# Patient Record
Sex: Female | Born: 1960
Health system: Western US, Academic
[De-identification: ages and names within clinical notes are randomized; demographics above are authoritative.]

---

## 2013-06-01 IMAGING — MG MAMMO DIAG BIL  W/CAD
4 series · 4 of 4 positions shown · non-contrast
Comparison: none

Images Obtained from [facility]
CLINICAL RA REF: Palpable lump left breast.   History of bilateral breast biopsies.
Comparison is made to exams dated:  02/23/2012 [facility] - [facility]OSPITAL], 01/14/2011, and 12/07/2008 [HOSPITAL] - [HOSPITAL].
The tissue of both breasts is heterogeneously dense. This may lower the sensitivity of mammography.
Current study was also evaluated with a Computer Aided Detection (CAD) system.
There is a 3.3 cm simple cyst in the left breast at 12 o'clock in the retroareolar region.  This correlates as palpated.
There also is a 3.5 cm x 1.8 cm x 2.2 cm fibroadenoma in the left breast at 3 o'clock middle depth.  This is increased in size and correlates with the biopsy.  There is a biopsy clip associated with
the fibroadenoma.
Additionally, there is a 2 cm simple cyst in the left breast at 2 o'clock middle depth 6 cm from the nipple.  This correlates as an incidental finding.
No other significant abnormalities are seen in either breast on the mammogram or left targeted ultrasound.
Your patient's mammogram demonstrates that she has dense breast tissue, which could hide small abnormalities.  In compliance with [place] 2522 the patient has been sent a letter which informs
her that she has dense breast tissue and might benefit from supplementary screening tests depending on her individual risk factors.  The patient may contact you if she has any questions or concerns.

[R CC]
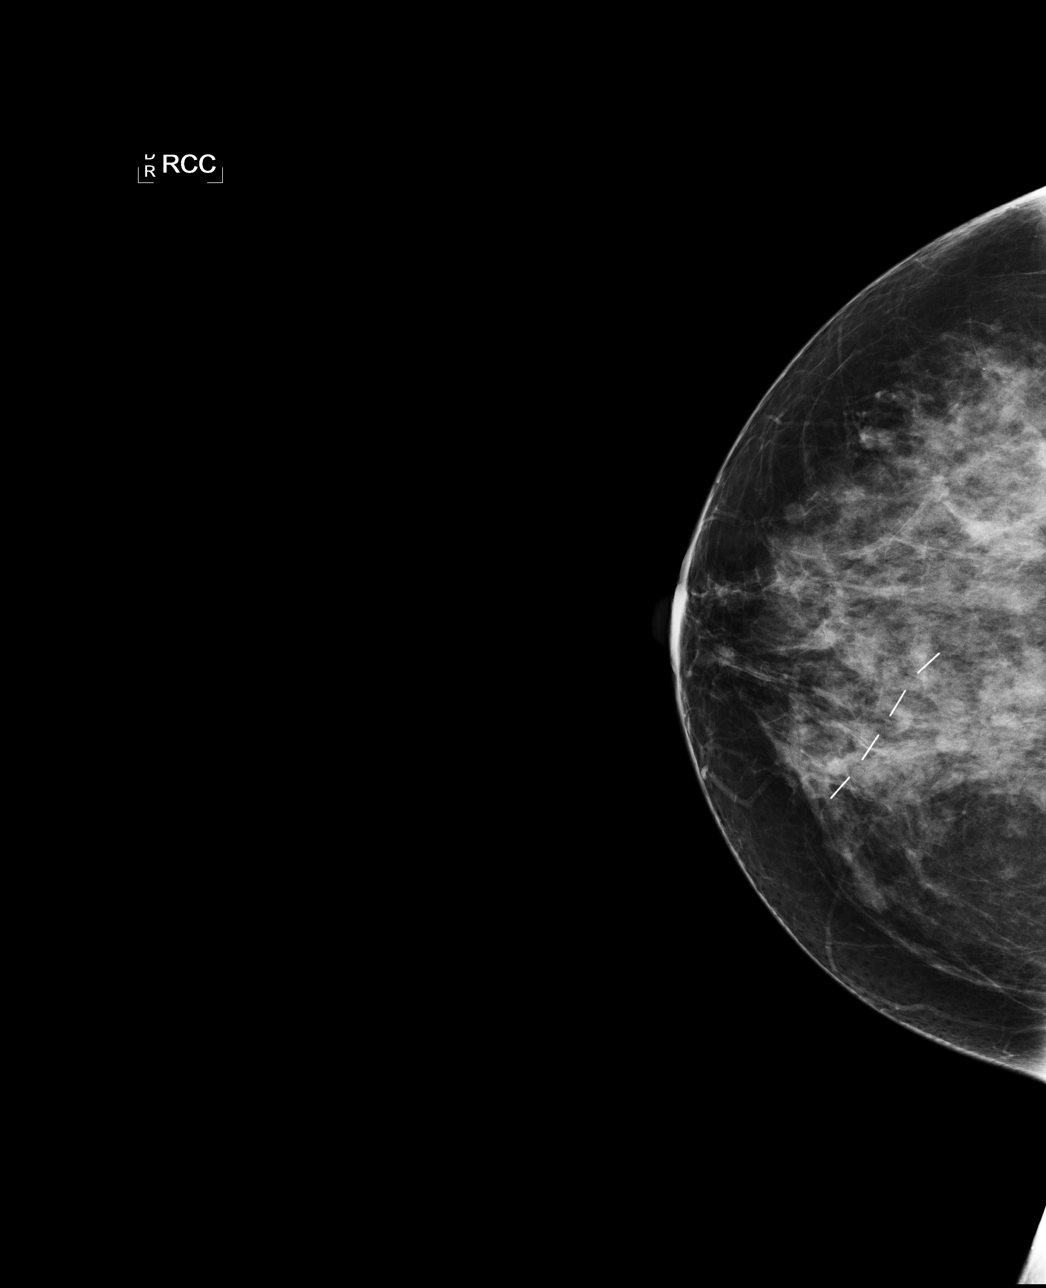

[L CC]
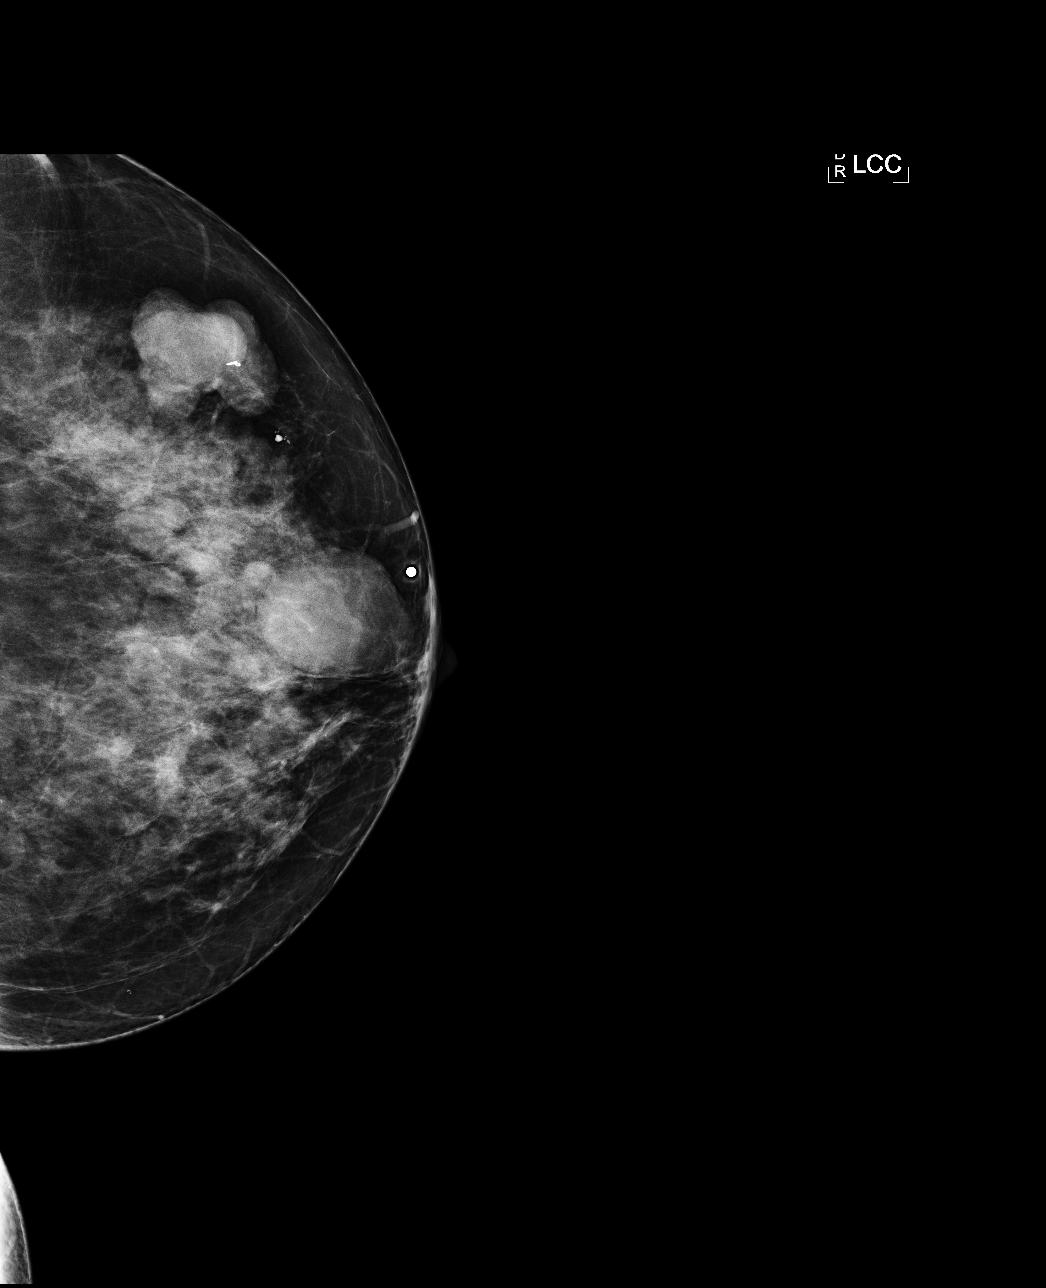

[L MLO]
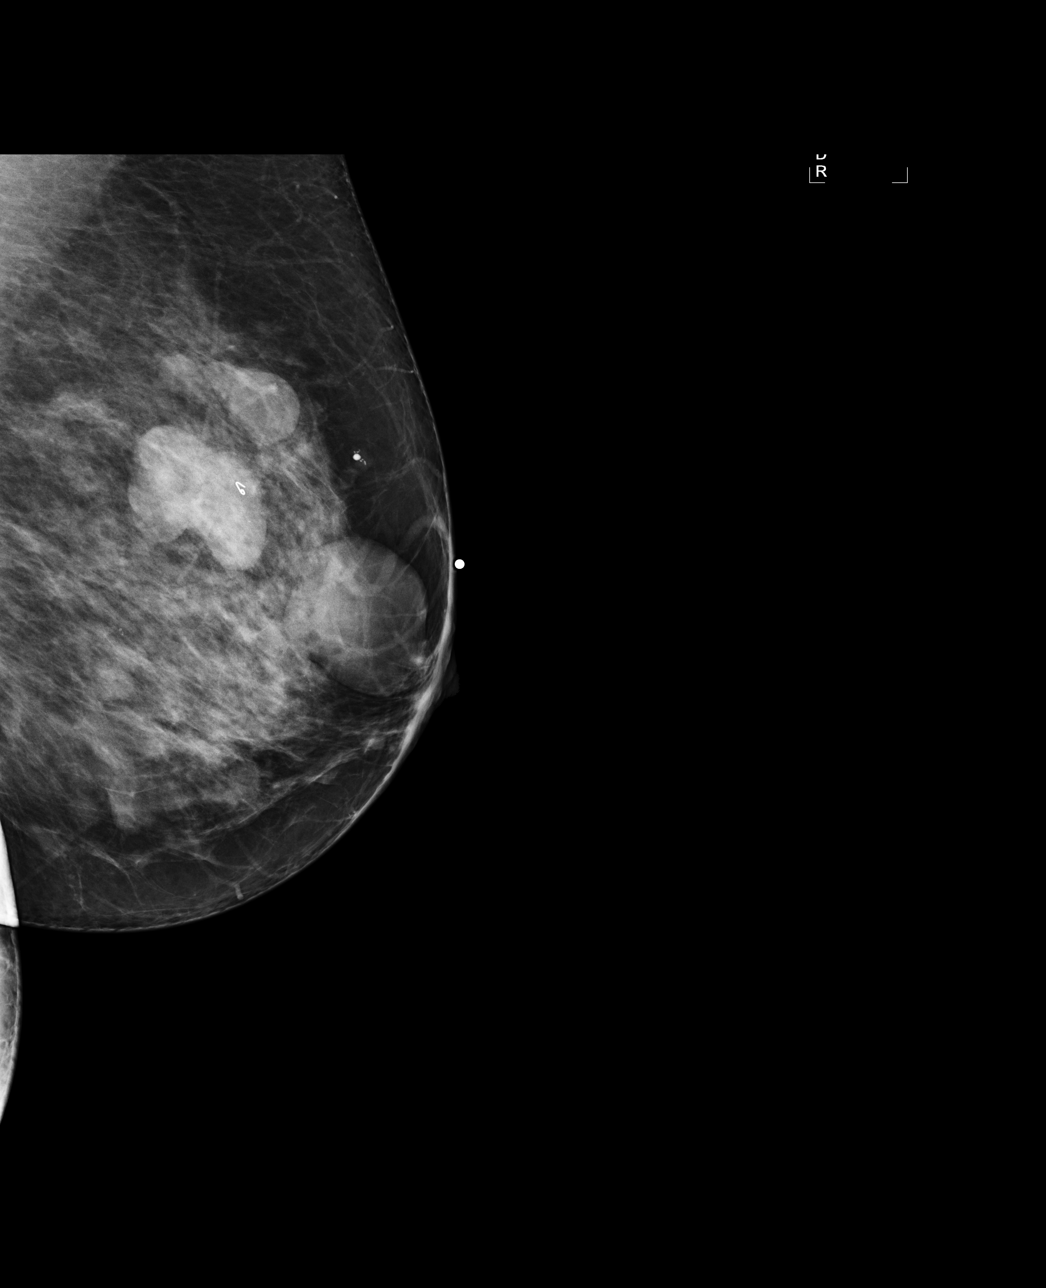

[R MLO]
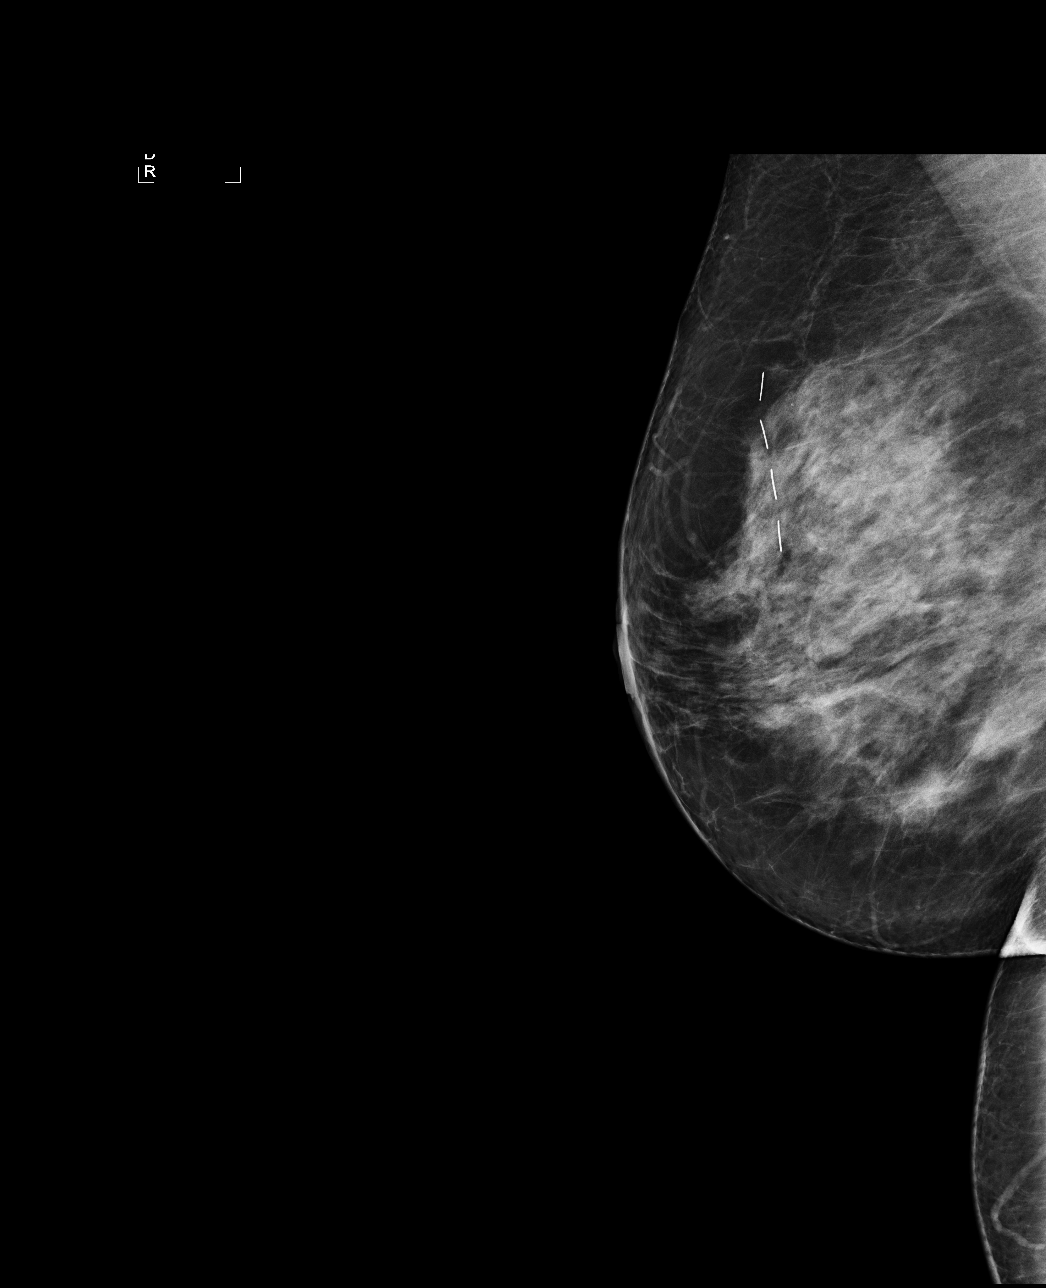

[4 of 4 positions shown; findings below may reference images not displayed]

IMPRESSION: , TARGETED ULTRASOUND IMPRESSION:
1. The 3.3 cm simple cyst in the left breast at 12 o'clock in the retroareolar region is benign.
2. The 3.5 cm x 1.8 cm x 2.2 cm fibroadenoma in the left breast at 3 o'clock middle depth has increased in size. Due to the increasing size, consider surgical consultation to discuss excision versus
ultrasound survelliance.
SUMMARY: Findings and recommendations were discussed with the patient.
mwm/:06/01/2013 [DATE]
letter sent: Normal
Mammogram BI-RADS: 2 Benign  Ultrasound BI-RADS: 2 Benign   [id number]

## 2013-06-01 IMAGING — US US BREAST LT
1 series · 13 of 16 positions shown · non-contrast
Comparison: none

Images Obtained from [facility]
CLINICAL RA REF: Palpable lump left breast.   History of bilateral breast biopsies.
Comparison is made to exams dated:  02/23/2012 [facility] - [[facility]OSPITAL], 01/14/2011, and 12/07/2008 [[facility]OS[facility]] - [HOSPITAL].
The tissue of both breasts is heterogeneously dense. This may lower the sensitivity of mammography.
Current study was also evaluated with a Computer Aided Detection (CAD) system.
There is a 3.3 cm simple cyst in the left breast at 12 o'clock in the retroareolar region.  This correlates as palpated.
There also is a 3.5 cm x 1.8 cm x 2.2 cm fibroadenoma in the left breast at 3 o'clock middle depth.  This is increased in size and correlates with the biopsy.  There is a biopsy clip associated with
the fibroadenoma.
Additionally, there is a 2 cm simple cyst in the left breast at 2 o'clock middle depth 6 cm from the nipple.  This correlates as an incidental finding.
No other significant abnormalities are seen in either breast on the mammogram or left targeted ultrasound.
Your patient's mammogram demonstrates that she has dense breast tissue, which could hide small abnormalities.  In compliance with [place] 2522 the patient has been sent a letter which informs
her that she has dense breast tissue and might benefit from supplementary screening tests depending on her individual risk factors.  The patient may contact you if she has any questions or concerns.

[Series 1: us breast left · 13 of 16 slices shown]
[im 1/16]
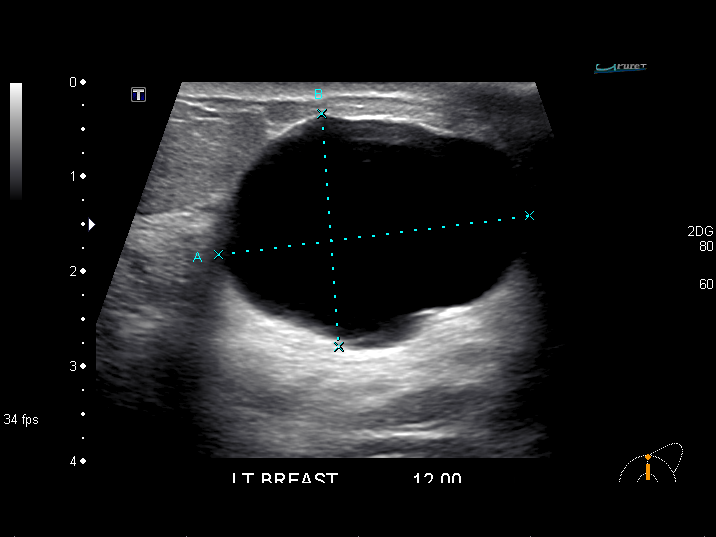
[im 2/16]
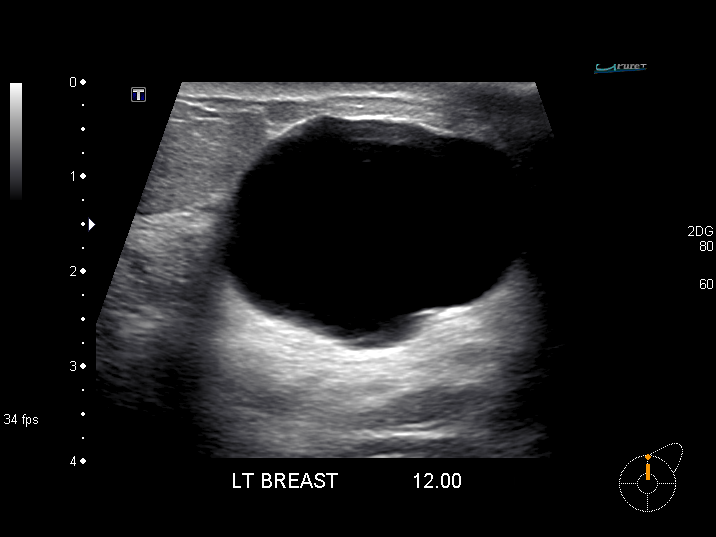
[im 4/16]
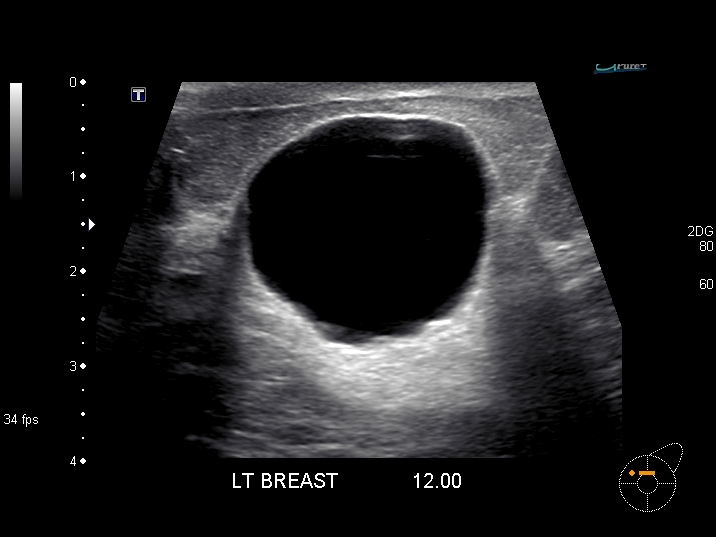
[im 5/16]
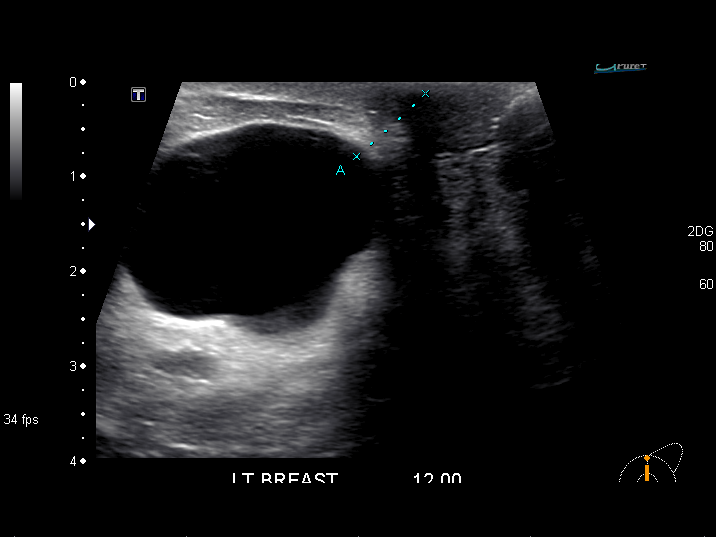
[im 6/16]
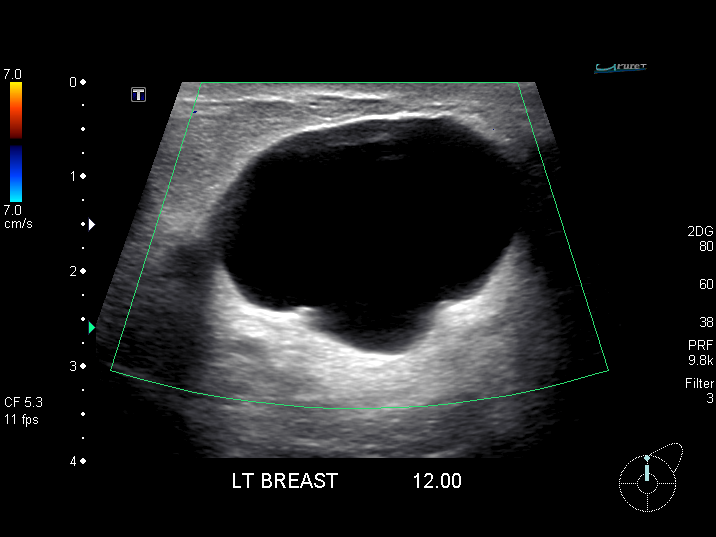
[im 7/16]
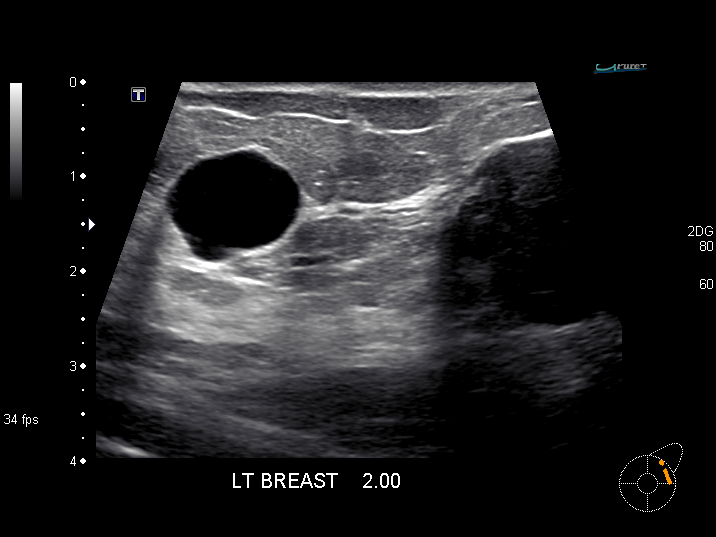
[im 9/16]
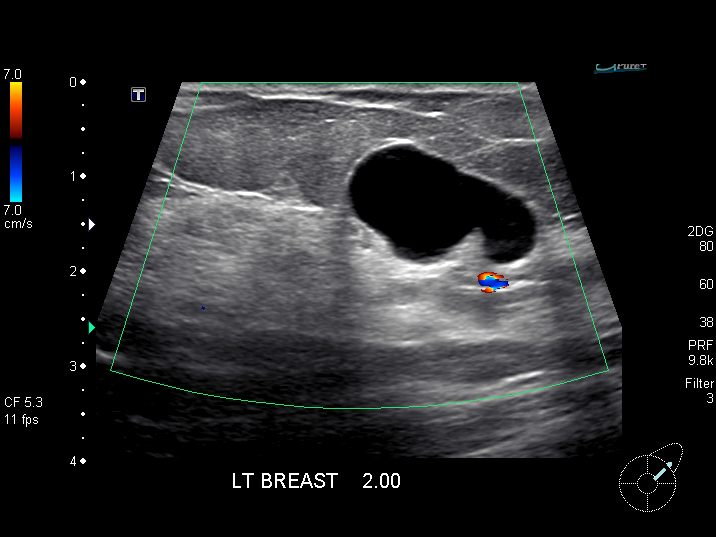
[im 10/16]
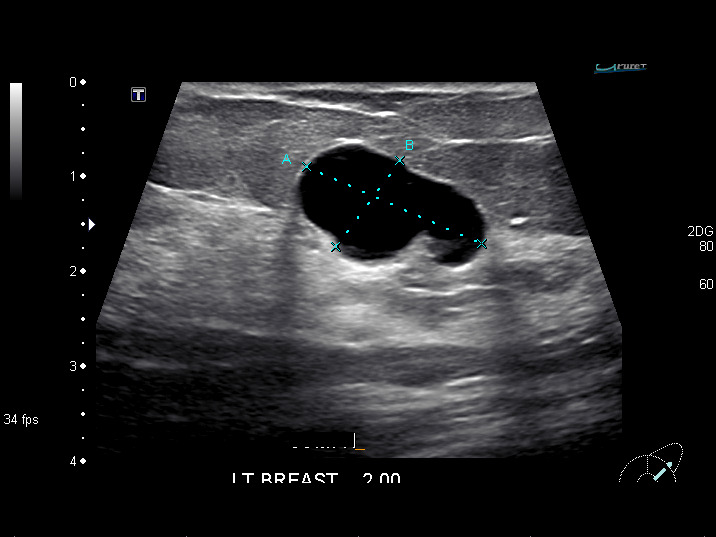
[im 11/16]
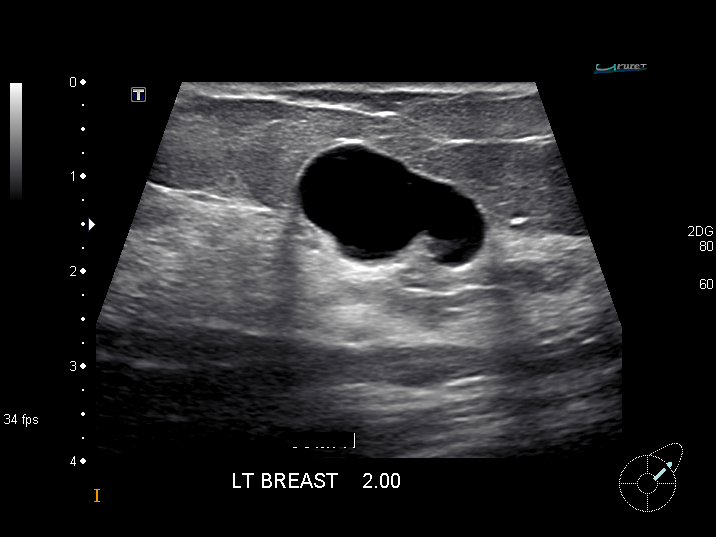
[im 12/16]
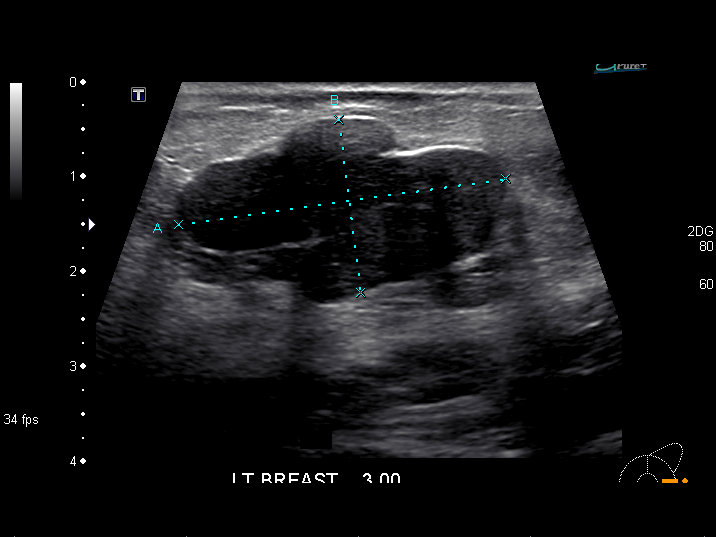
[im 13/16]
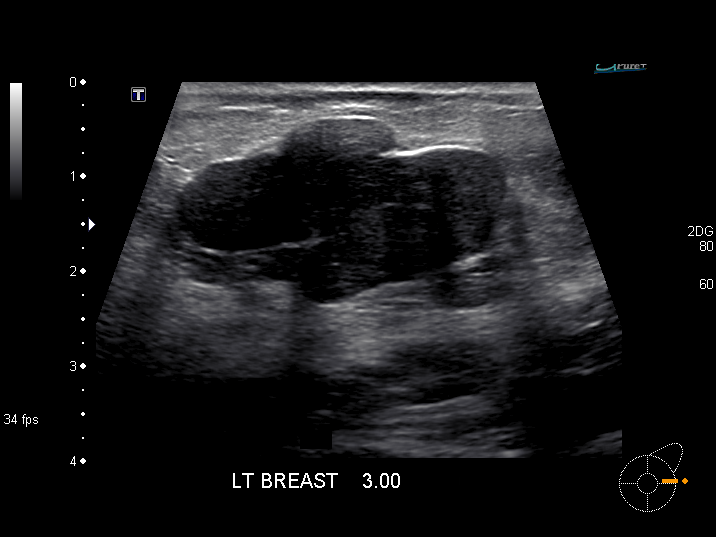
[im 15/16]
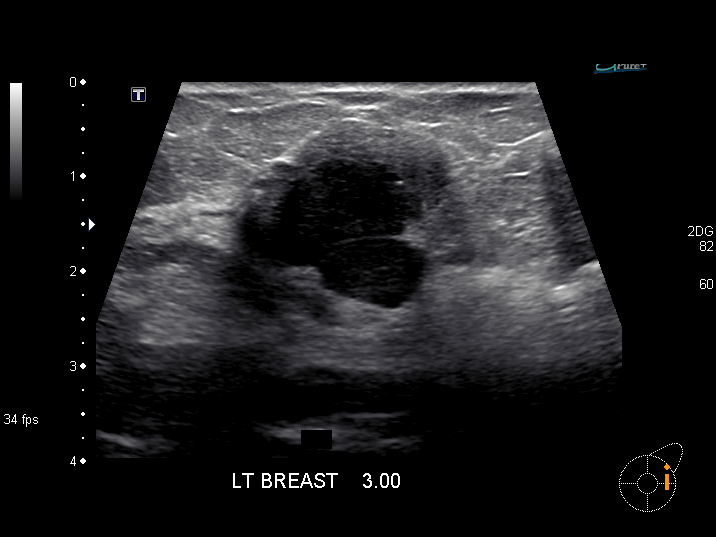
[im 16/16]
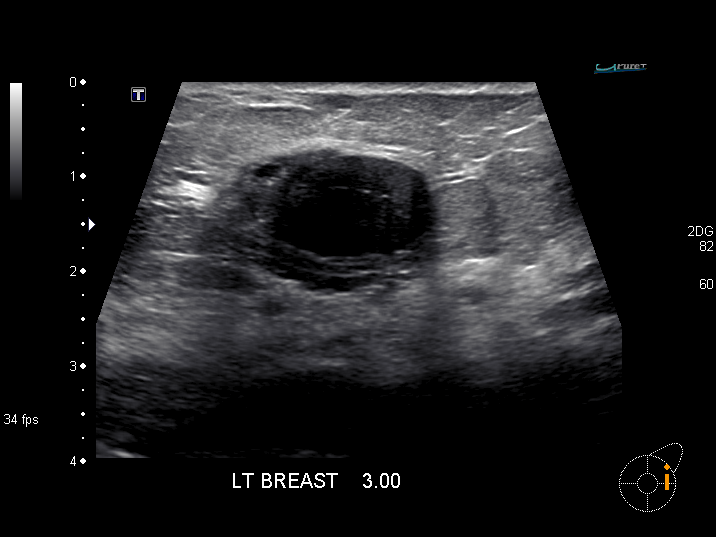

[13 of 16 positions shown; findings below may reference images not displayed]

IMPRESSION: , TARGETED ULTRASOUND IMPRESSION:
1. The 3.3 cm simple cyst in the left breast at 12 o'clock in the retroareolar region is benign.
2. The 3.5 cm x 1.8 cm x 2.2 cm fibroadenoma in the left breast at 3 o'clock middle depth has increased in size. Due to the increasing size, consider surgical consultation to discuss excision versus
ultrasound survelliance.
SUMMARY: Findings and recommendations were discussed with the patient.
mwm/:06/01/2013 [DATE]
letter sent: Normal
Mammogram BI-RADS: 2 Benign  Ultrasound BI-RADS: 2 Benign   [id number]

## 2016-02-19 IMAGING — US DOP CAROTID BILATERAL
1 series · 15 of 24 positions shown · non-contrast
Comparison: None

HISTORY: Systolic murmur
TECHNIQUE: Carotid Doppler ultrasound.

[Series 1: dop carotid bilateral · 15 of 41 slices shown]
[im 1/41]
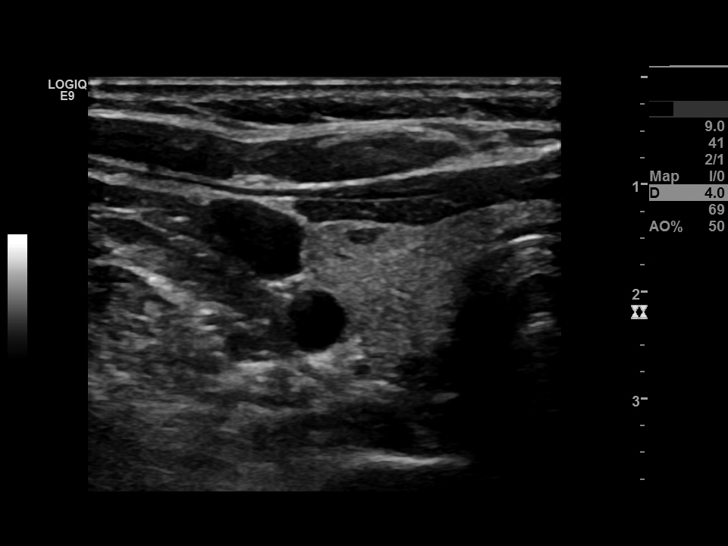
[im 4/41]
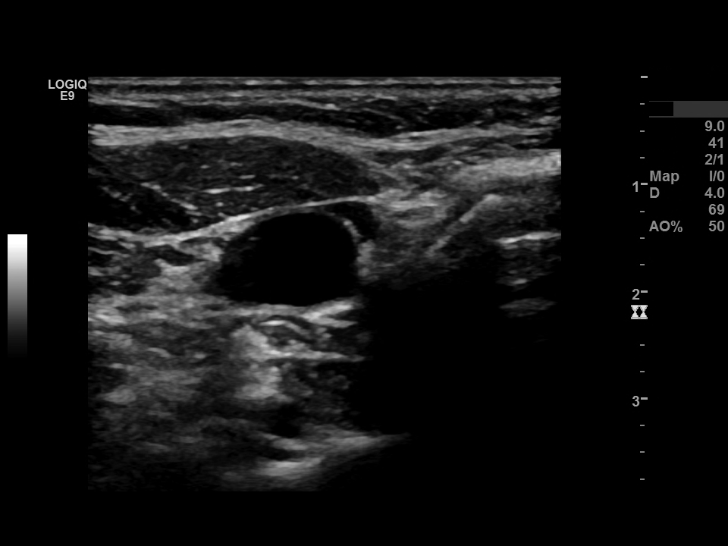
[im 7/41]
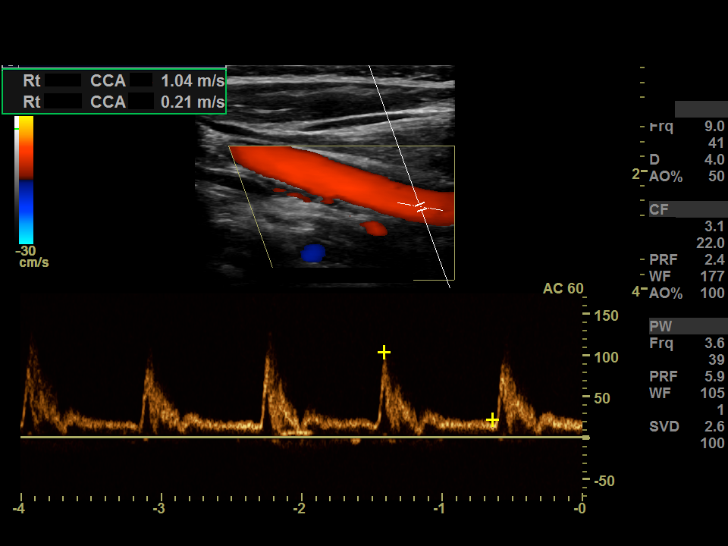
[im 9/41]
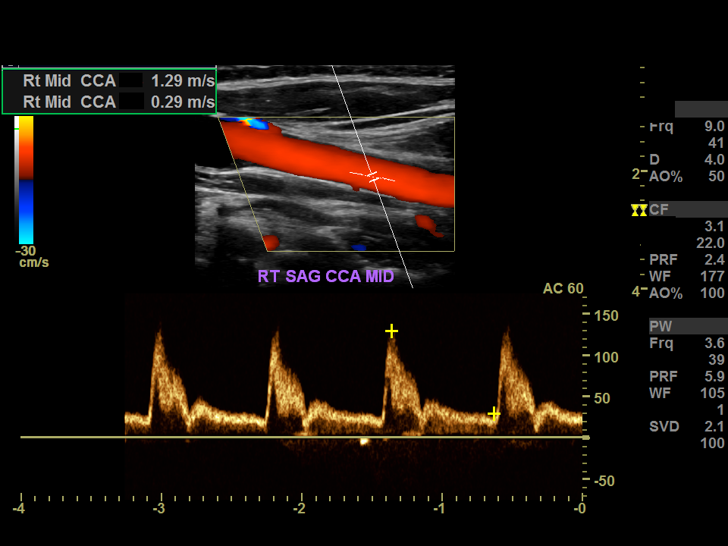
[im 13/41]
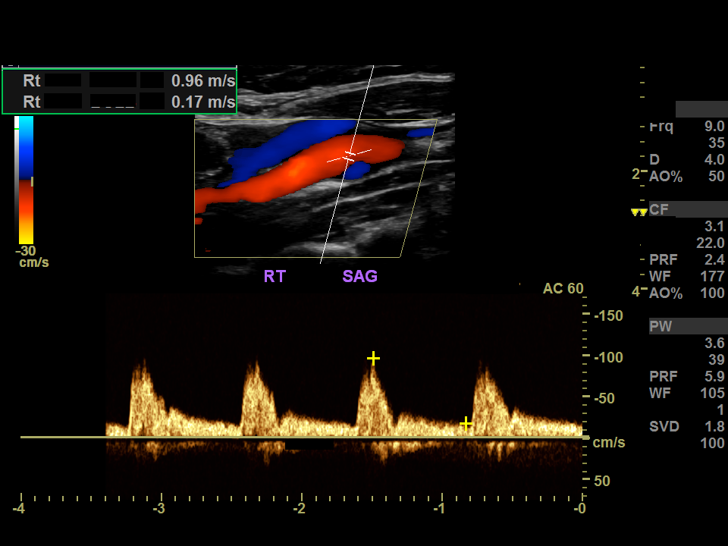
[im 14/41]
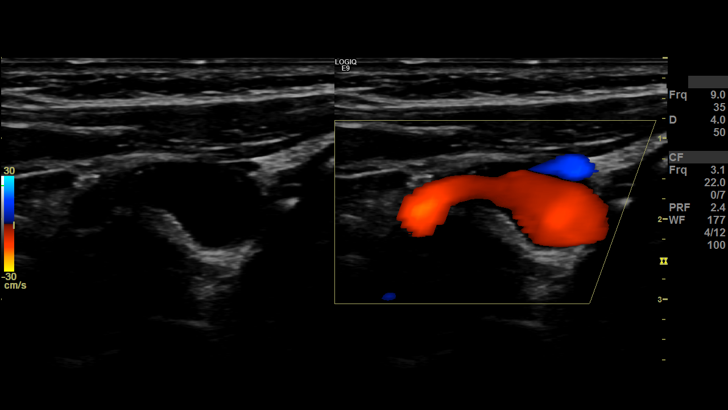
[im 18/41]
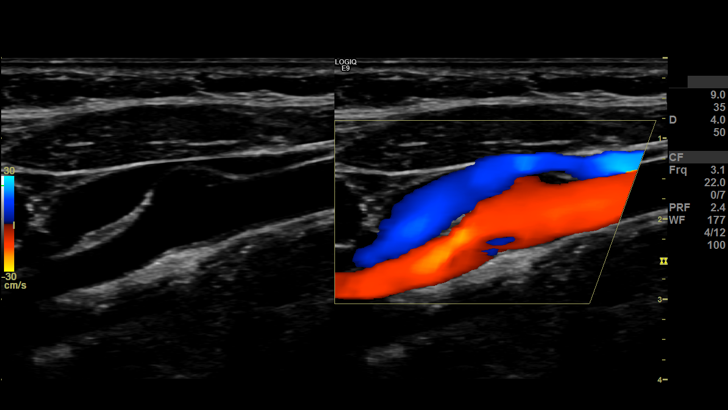
[im 21/41]
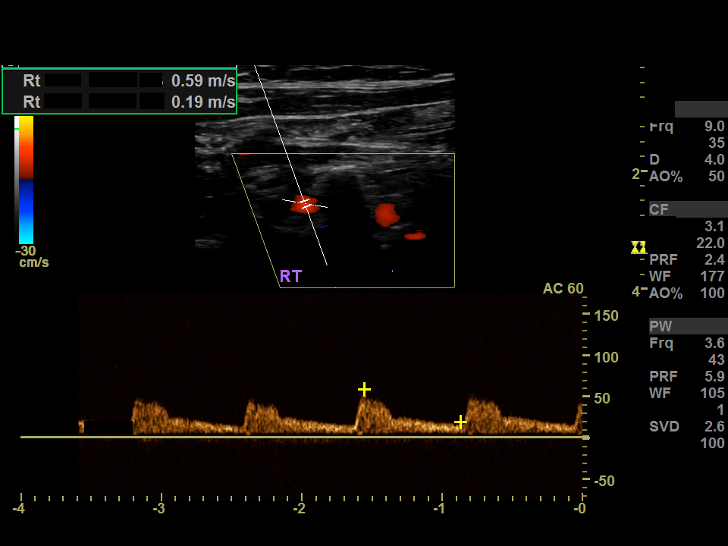
[im 23/41]
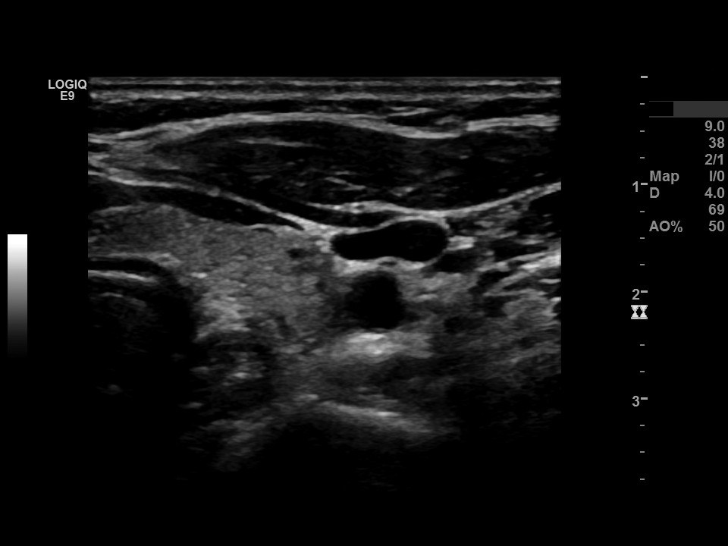
[im 27/41]
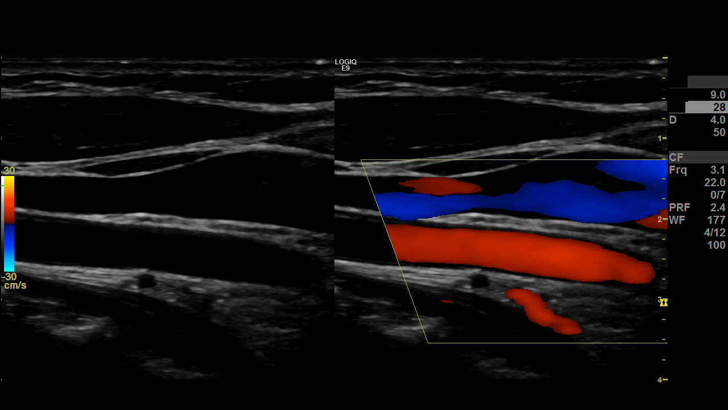
[im 28/41]
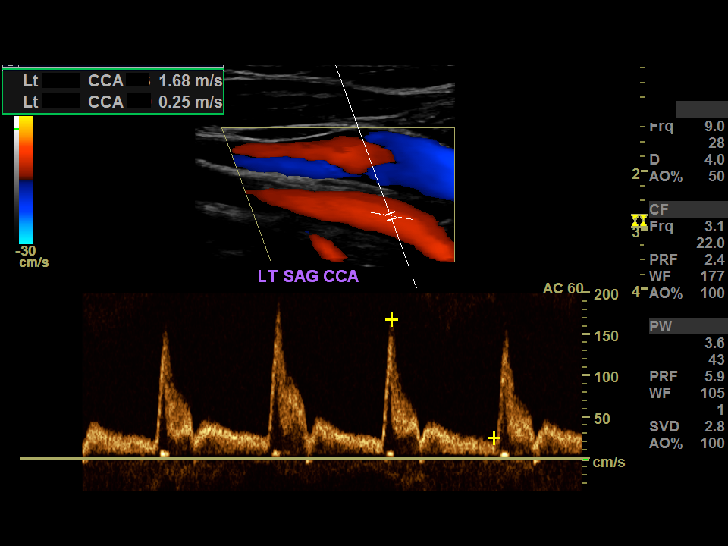
[im 32/41]
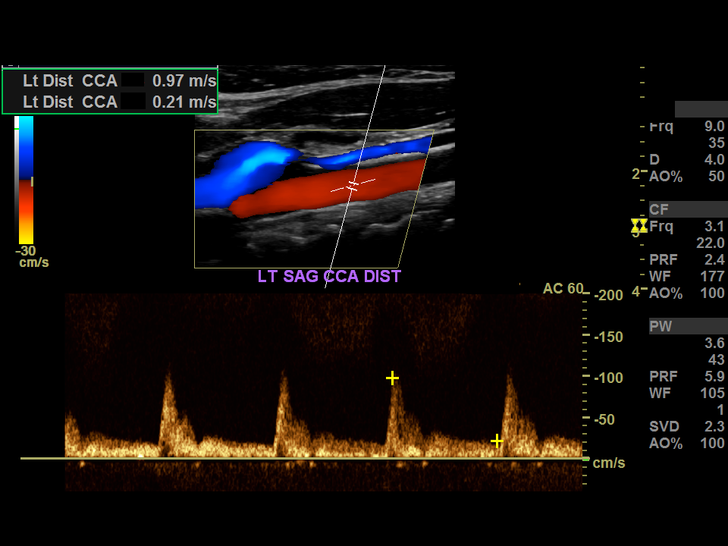
[im 35/41]
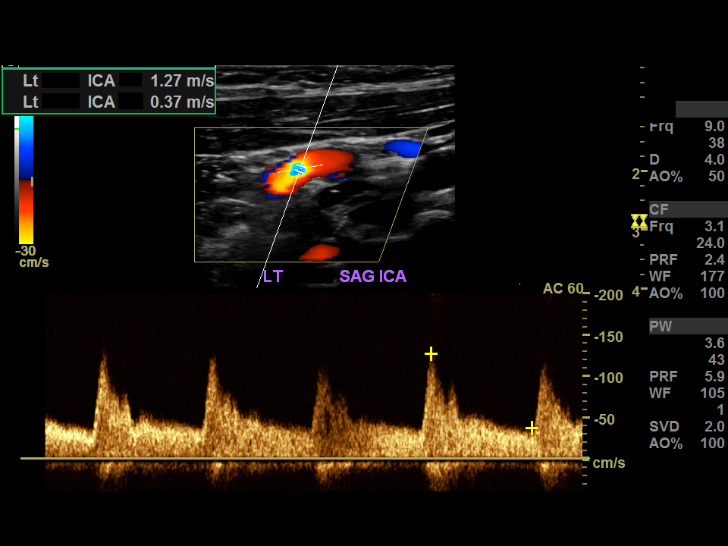
[im 37/41]
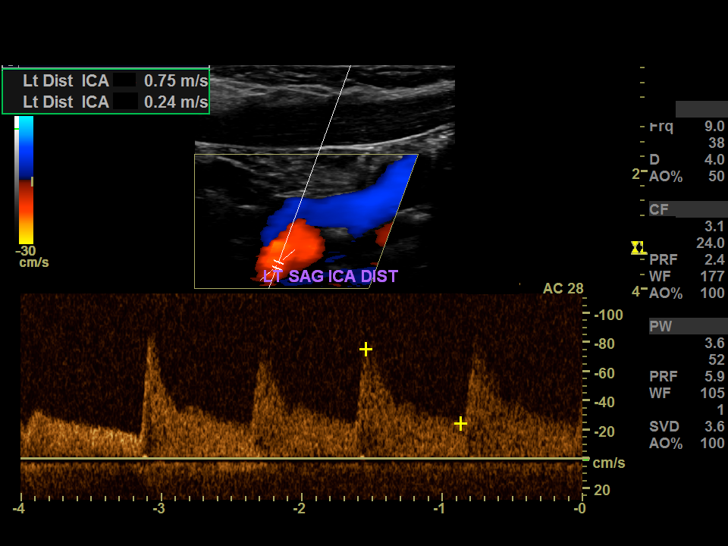
[im 41/41]
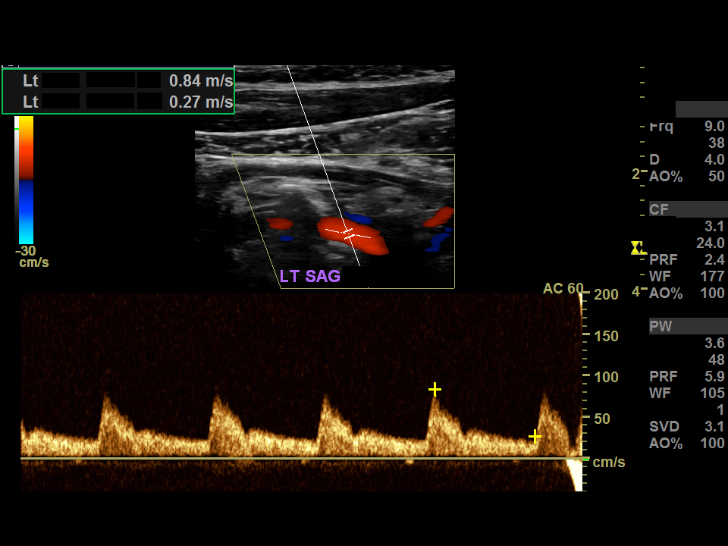

[15 of 24 positions shown; findings below may reference images not displayed]

FINDINGS: Duplex Doppler examination and color flow images were obtained of the carotid artery systems bilaterally.  The common carotid and internal carotid arteries are free of significant plaque.  Peak systolic velocities are normal, and there are no areas of flow disturbance or turbulence.  ICA/CCA velocity ratios are normal.  Both vertebral arteries demonstrate antegrade flow.
IMPRESSION: No evidence of hemodynamically significant stenosis.

## 2016-02-19 IMAGING — MG MAMMO SCRN BIL W/CAD COMBO
6 of 10 series · 6 of 26 positions shown · non-contrast
Comparison: none

Images Obtained from [facility]
CLINICAL RA REF: Mammo Scrn bilateral (Digital) W/Cad Routine with Tomosynthesis.
Digital images were generated from the 3D Tomosynthesis data acquired during the exam.
Comparison is made to exams dated:  06/01/2013 [facility] - [facility]], 02/23/2012 [HOSPITAL] - [HOSPITAL], and 01/14/2011 [facility]
The tissue of both breasts is heterogeneously dense. This may lower the sensitivity of mammography.
Current study was also evaluated with a Computer Aided Detection (CAD) system.
There are benign cysts in both breasts.  There also are post operative findings in both breasts.
No significant masses, calcifications, or other findings are seen in either breast.
There has been no significant interval change.
Your patient's mammogram demonstrates that she has dense breast tissue, which could hide small abnormalities.  In compliance with [place] Act [place]. 3883 the patient has been sent a letter which informs
her that she has dense breast tissue and might benefit from supplementary screening tests depending on her individual risk factors.  The patient may contact you if she has any questions or concerns.

[R MLO (1 of 2)]
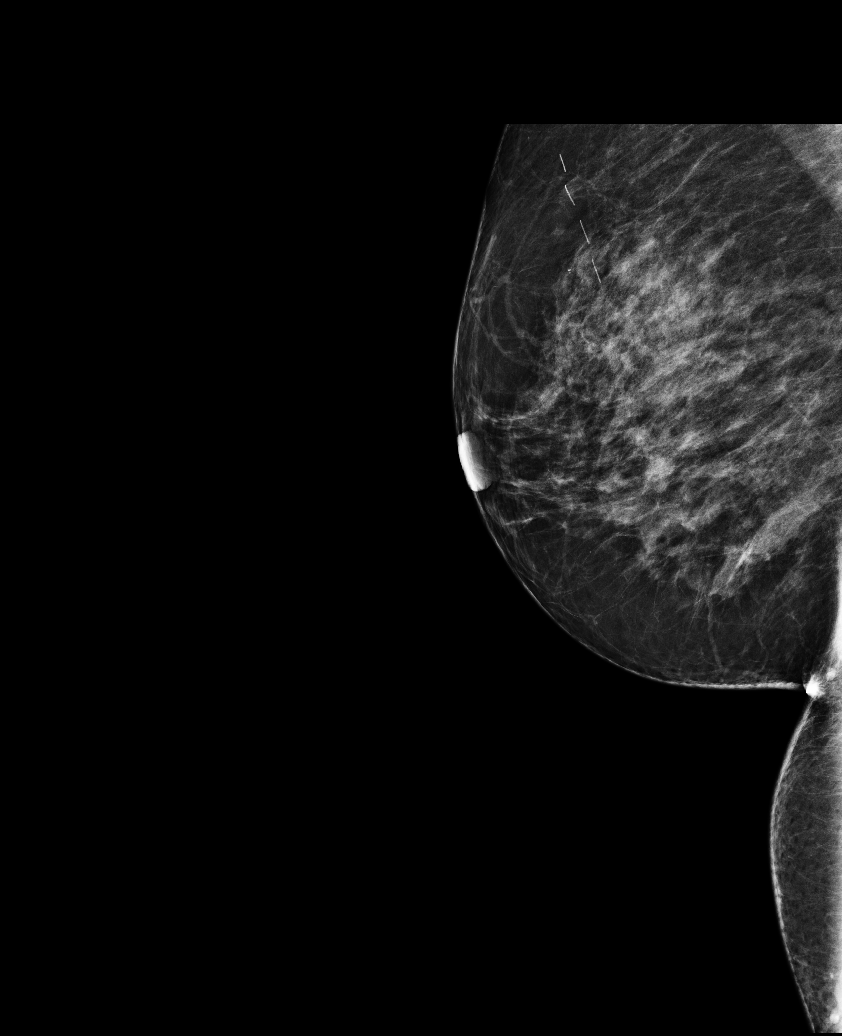

[L MLO (1 of 2)]
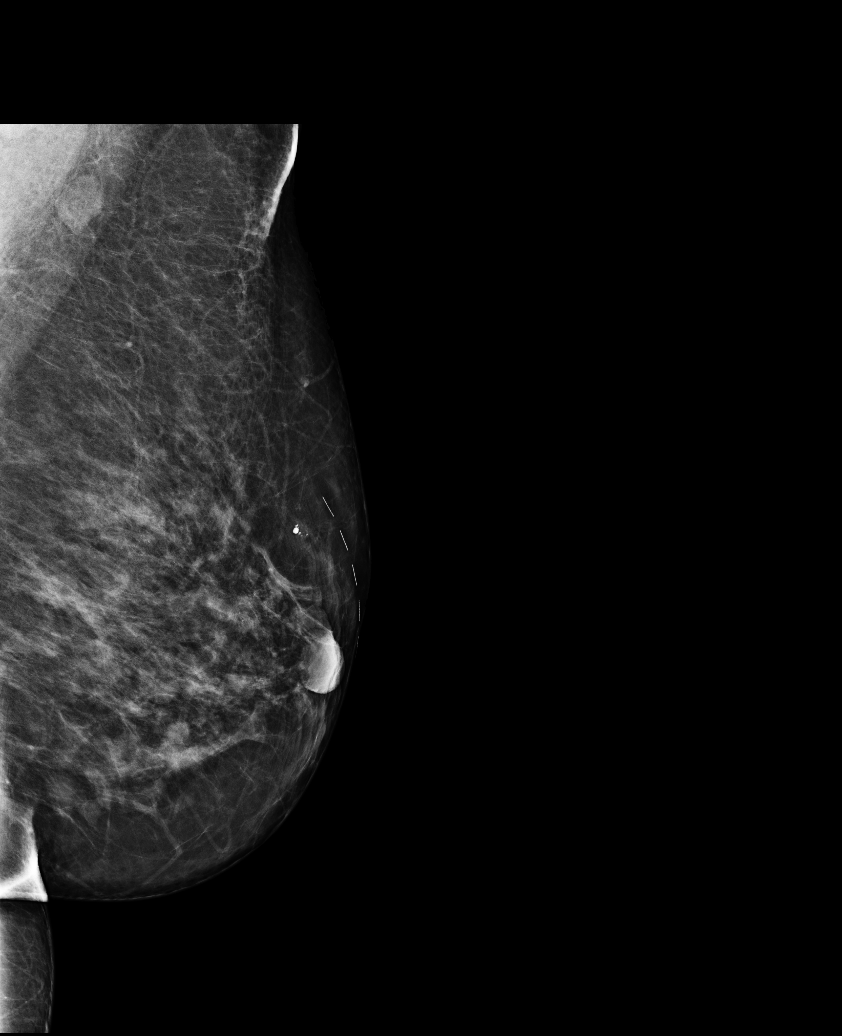

[R MLO (2 of 2)]
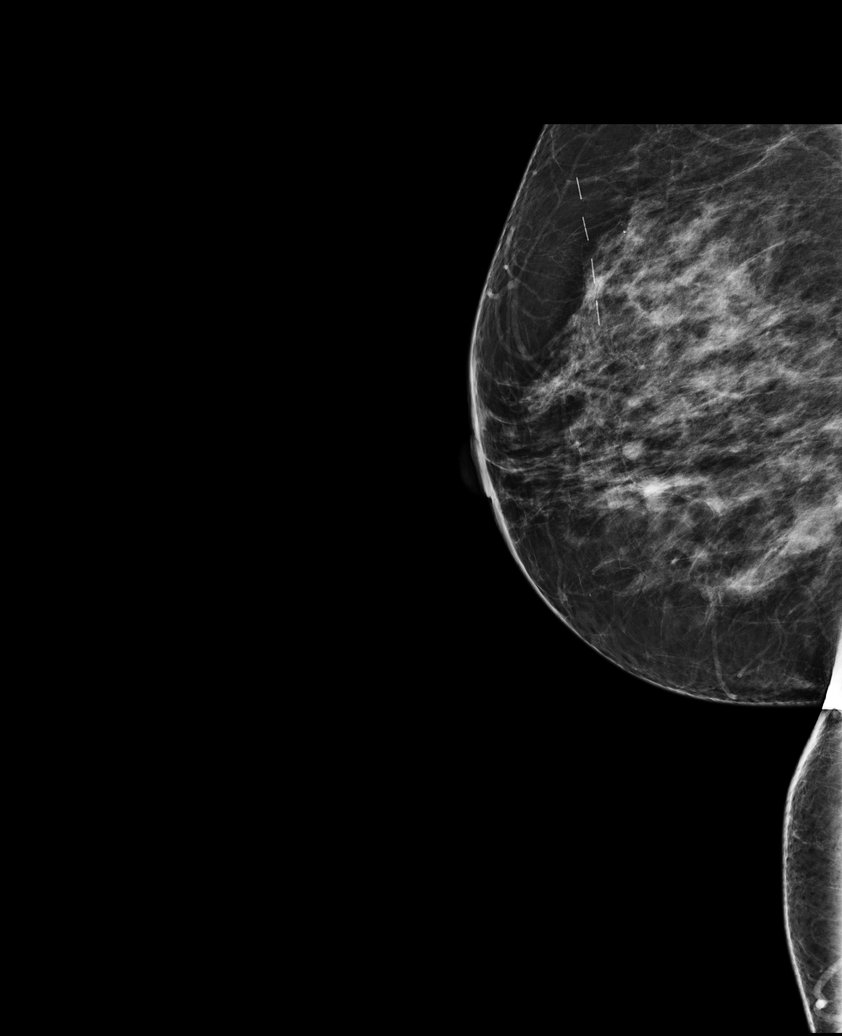

[L MLO (2 of 2)]
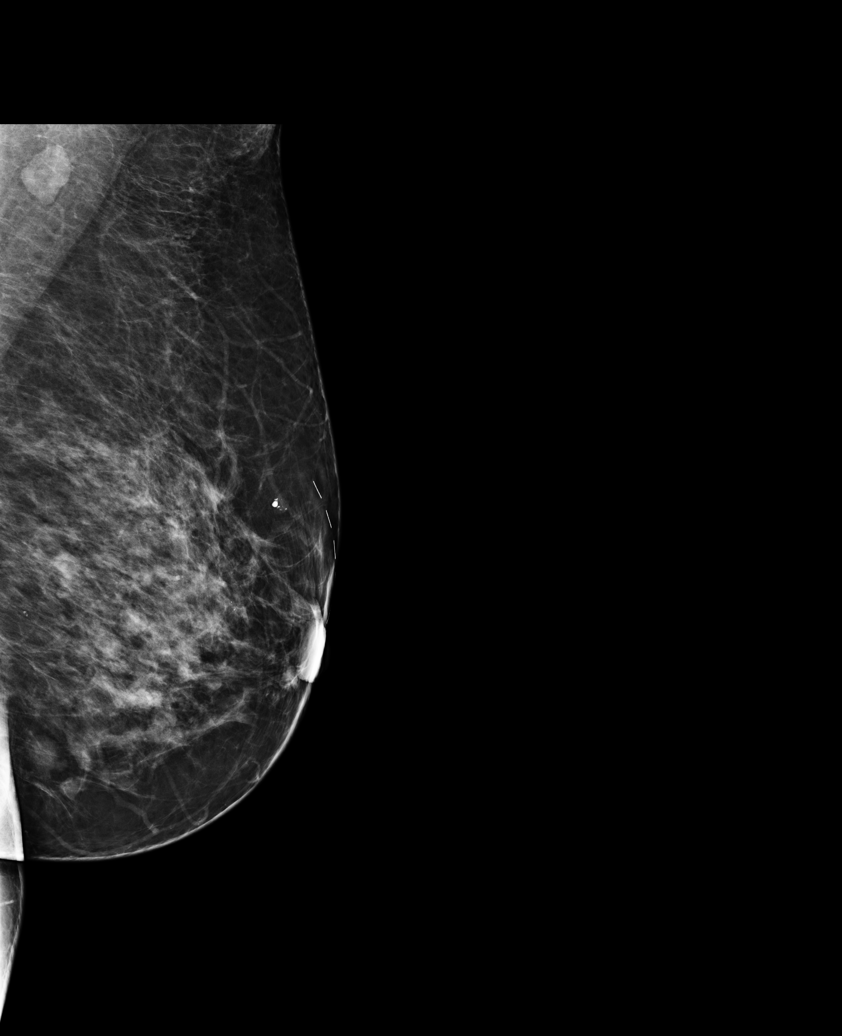

[R CC]
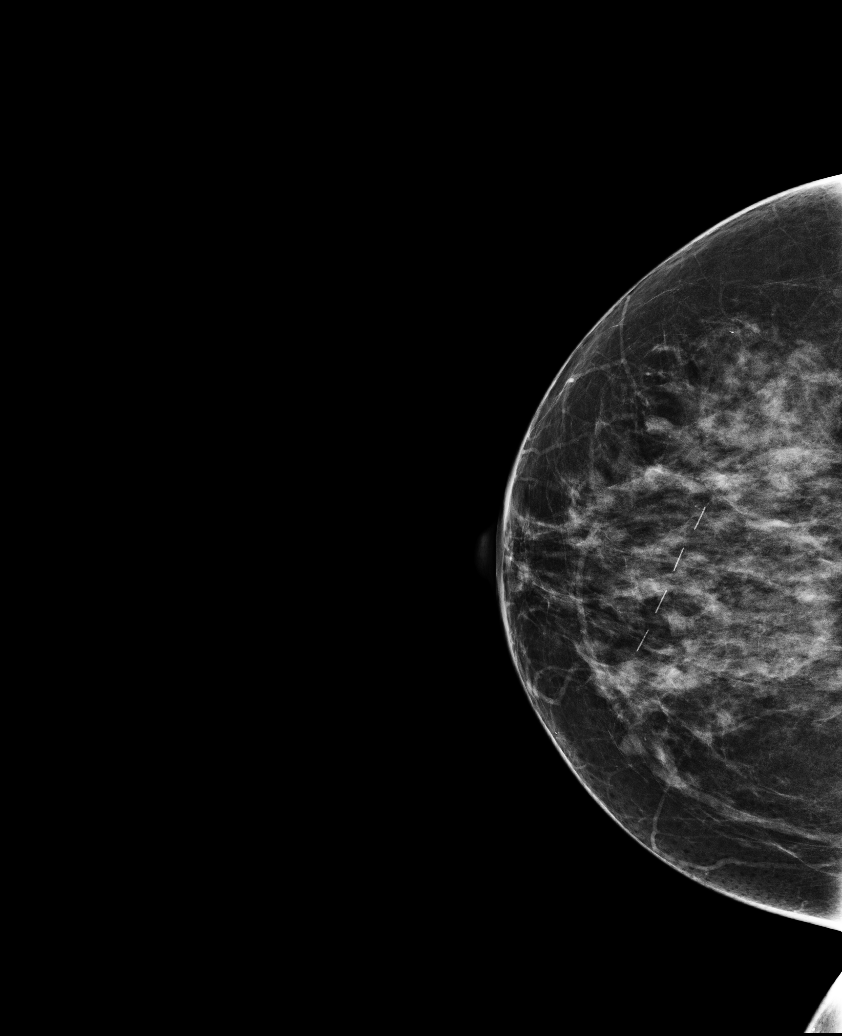

[L CC]
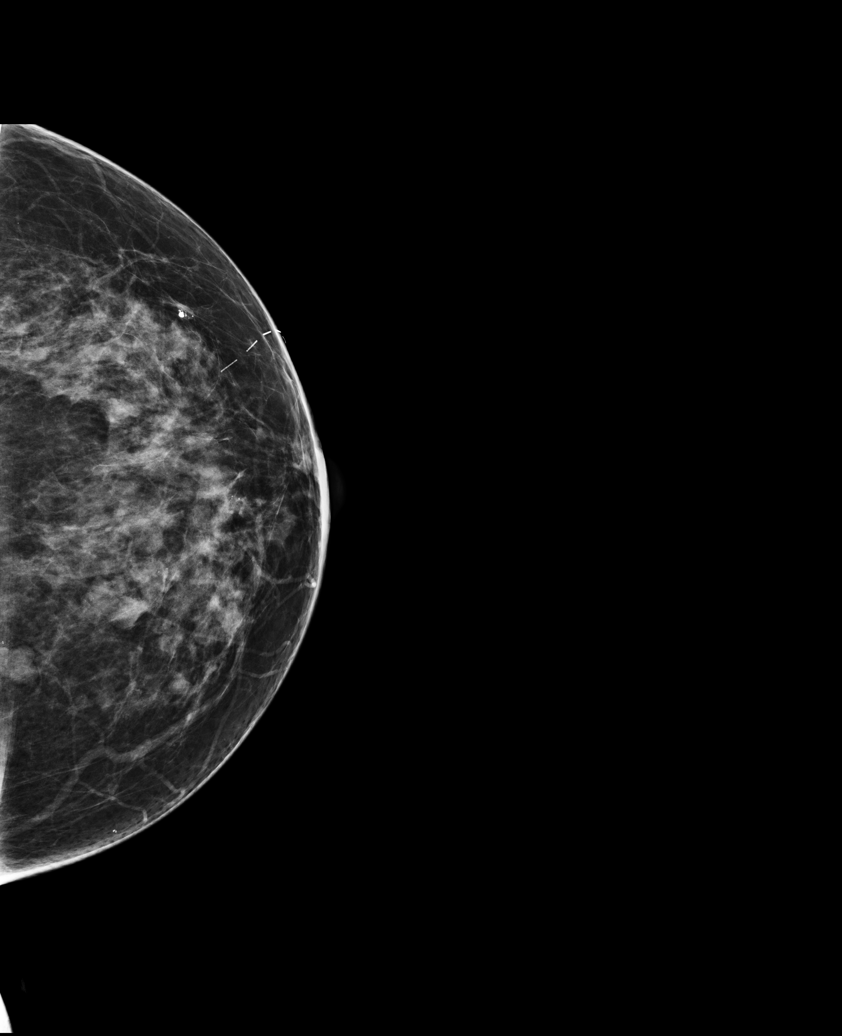

[6 of 26 positions shown; findings below may reference images not displayed]

IMPRESSION: There is no mammographic evidence of malignancy. A 1 year screening mammogram is recommended.
mc/penrad:02/22/2016 [DATE]
letter sent: Normal
Mammogram BI-RADS: 2 Benign

## 2018-09-28 ENCOUNTER — Institutional Professional Consult (permissible substitution): Payer: BLUE CROSS/BLUE SHIELD

## 2018-09-28 ENCOUNTER — Inpatient Hospital Stay: Payer: BLUE CROSS/BLUE SHIELD

## 2018-09-28 DIAGNOSIS — M48061 Spinal stenosis, lumbar region without neurogenic claudication: Secondary | ICD-10-CM

## 2018-09-28 DIAGNOSIS — Z01818 Encounter for other preprocedural examination: Secondary | ICD-10-CM

## 2018-09-28 NOTE — Progress Notes
PATIENT: Valerie Duncan   MRN: [id number]   DOB: 1961/05/03   DATE OF SERVICE:  09/28/2018   CARE TEAM: Patient Care Team:  Clydene Pugh, DO as PCP - General (Family Medicine)    Subjective:   REFERRING PHYSICIAN/SURGEON: Dr. Nigel Bridgeman  PLANNED SURGERY:  ALIF  PLANNED SURGERY DATE: 10/06/2018  Diagnosis: Stenosis    Review of Systems:  CONSTITUTIONAL:  No night sweats.  No fatigue, malaise, lethargy.  No fever or chills.  EYES:  No visual changes.  No eye pain.  No eye discharge.    ENT:  No runny nose.  No epistaxis.  No sinus pain.  No sore throat.  No odynophagia.  No ear pain.  No congestion.  BREASTS:  No breast pain, soreness, lumps, or discharge.  RESPIRATORY:  No cough.  No wheeze.  No hemoptysis.  No shortness of breath.  CARDIOVASCULAR:  No chest pains.  No palpitations. No syncopal episodes  GASTROINTESTINAL:  No abdominal pain.  No nausea or vomiting.  No diarrhea or constipation.  No hematemesis.  No hematochezia.  No melena.  GENITOURINARY:  No urgency.  No frequency.  No dysuria.  No hematuria.  No obstructive symptoms.  No discharge.  No pain.  No significant abnormal bleeding.  MUSCULOSKELETAL:  No musculoskeletal pain.  No joint swelling.  No arthritis.  NEUROLOGICAL:   No headache or neck pain.  No syncope or seizure. No motor , sensory or balance changes  PSYCHIATRIC:  No cognitive, memory or mood changes. No substance abuse.  SKIN:  No rashes.  No lesions.  No wounds.  ENDOCRINE:  No unexplained weight loss.  No polydipsia.  No polyuria.  No polyphagia.  HEMATOLOGIC:  No anemia.  No purpura.  No petechiae.  No prolonged or excessive bleeding.     ALLERGIC AND IMMUNOLOGIC:  No pruritus.  No swelling.     History of Left knee arthroscopy, spinal surgery x2      Family history of heart disease    No outpatient medications prior to visit.     No facility-administered medications prior to visit.        No Known Allergies    Social History     Tobacco Use   ??? Smoking status: Never Smoker ??? Smokeless tobacco: Never Used   Substance Use Topics   ??? Alcohol use: Yes     Comment: socially   ??? Drug use: Never         There is no immunization history on file for this patient.    Prior general anaesthetic - yes  Prior anaesthetic complications - no  HX of cardiac disease - no  Hx of pulmonary disease - no  HX of OSA - no  Smoker - no  ETOH abuse - no  Recreational drugs - no  IVDA -no      Objective:   BP 113/72  ~ Pulse 75  ~ Temp 36.9 ???C (98.5 ???F) (Tympanic)  ~ Resp 18  ~ Ht 5' 4'' (1.626 m)  ~ Wt 148 lb 8 oz (67.4 kg)  ~ SpO2 99%  ~ BMI 25.49 kg/m???        General:  WDWN and in NAD  Cognition normal  Affect normal  HEENT:  EOMI, PERRLA, no conjunctival injection  TM translucent bilaterally with no effusion  Nasopharynx and oropharynx clear  No thyromegaly  Lymph:  No lymphadenopathy  Heart  Norm s1/2. No murmur. No gallop. No JVD. No edema  Periphral pulses norm  Capillary refill normal  Lungs  CTAB, no wheezing, rhonci or rales  Abdominal  Norm BS. No guarding. No rebound. Non distended. No mass. No visceromegaly. No hernia. No tenderness. No renal bruit.  Skin  No rashes      MSK grossly norm        MedicalDecisionMaking/Assessment/Plan:   In addition to the above information???  EKG interpreted:NSRR    Results for orders placed or performed in visit on 09/28/18   Bacterial Culture Urine   Result Value Ref Range    Bacterial Culture Urine No Significant Growth    Hgb A1c - HPLC   Result Value Ref Range    Hgb A1c - HPLC 5.5 <5.7 %     Comment: For patients with diabetes, an A1c less than (<) or equal (=) to 7.0% is recommended for most patients, however the goal may be higher or lower depending on age and/or other medical problems.   For a diagnosis of diabetes, A1c greater than (>) or equal(=) to 6.5% indicates diabetes; values between 5.7% and 6.4% may indicate an increased risk of developing diabetes.   Vitamin B12   Result Value Ref Range    Vitamin B12 521 254-1,060 pg/mL Comment: Ingestion of high levels of biotin in dietary supplements may lead to falsely increased results.   Vitamin D 25-OH; COMMON, deficiency status   Result Value Ref Range    Vitamin D,25-Hydroxy 17 (L) 20 - 50 ng/mL     Comment: Deficiency: less than 12 ng/mL                                                   Inadequate: 12-19 ng/mL                                                       Adequate: 20-50 ng/mL                                                             Potential adverse effects: greater than 50 ng/mL       Prothrombin Time Panel   Result Value Ref Range    Prothrombin Time 12.7 11.5 - 14.4 seconds    INR 1.0 .     Comment: Therapeutic Range: INR 2-3  Mechanical Valves: INR 2.5-3.5     APTT   Result Value Ref Range    APTT 33.0 24.4 - 36.2 seconds     Comment: aPTT based therapeutic range for unfractionated heparin therapy is 85.5-111.6 seconds.   Comprehensive Metabolic Panel   Result Value Ref Range    Sodium 138 135 - 146 mmol/L    Potassium 4.3 3.6 - 5.3 mmol/L    Chloride 101 96 - 106 mmol/L    Total CO2 25 20 - 30 mmol/L    Anion Gap 12 8 - 19 mmol/L    Glucose 95 65 - 99 mg/dL    GFR Estimate for Non-African American >89 See GFR  Additional Information mL/min/1.88m2    GFR Estimate for African American >89 See GFR Additional Information mL/min/1.59m2    GFR Additional Information See Comment      Comment: GFR >89        Normal  GFR 60 - 89    Normal to mildly decreased  GFR 45 - 59    Mildly to moderately decreased  GFR 30 - 44    Moderately to severely decreased  GFR 15 - 29    Severely decreased  GFR <15        Kidney failure  The CKD-EPI equation was used to estimate GFR and assumes stable creatinine concentrations.  Results are in mL/min/1.73 square meters.    Creatinine 0.56 (L) 0.60 - 1.30 mg/dL    Urea Nitrogen 16 7 - 22 mg/dL    Calcium 9.8 8.6 - 32.4 mg/dL    Total Protein 7.3 6.1 - 8.2 g/dL    Albumin 4.6 3.9 - 5.0 g/dL    Bilirubin,Total 0.5 0.1 - 1.2 mg/dL Alkaline Phosphatase 79 37 - 113 U/L    Aspartate Aminotransferase 20 13 - 47 U/L    Alanine Aminotransferase 10 8 - 64 U/L   UA,Dipstick   Result Value Ref Range    Urine Color Colorless      Specific Gravity 1.008 1.005 - 1.030    pH,Urine 7.5 5.0 - 8.0    Blood Trace (A) Negative     Comment: Confirmed by alternate method.      Bilirubin Negative Negative    Ketones Negative Negative    Glucose Negative Negative    Protein Negative Negative    Leukocyte Esterase 3+ (A) Negative    Nitrite Negative Negative   UA,Microscopic   Result Value Ref Range    RBC per uL 19 (H) 0 - 11 cells/uL    WBC per uL 314 (H) 0 - 22 cells/uL    RBC per HPF 4 (H) 0 - 2 cells/HPF    WBC per HPF 63 (H) 0 - 4 cells/HPF    WBC Clumps Present (A) Absent    Squamous Epi Cells 11 0 - 17 cells/uL   CBC   Result Value Ref Range    White Blood Cell Count 5.94 4.16 - 9.95 x10E3/uL    Red Blood Cell Count 4.58 3.96 - 5.09 x10E6/uL    Hemoglobin 13.4 11.6 - 15.2 g/dL    Hematocrit 40.1 02.7 - 45.2 %    Mean Corpuscular Volume 93.0 79.3 - 98.6 fL    Mean Corpuscular Hemoglobin 29.3 26.4 - 33.4 pg    MCH Concentration 31.5 31.5 - 35.5 g/dL    Red Cell Distribution Width-SD 46.6 36.9 - 48.3 fL    Red Cell Distribution Width-CV 13.5 11.1 - 15.5 %    Platelet Count, Auto 290 143 - 398 x10E3/uL    Mean Platelet Volume 9.7 9.3 - 13.0 fL    Nucleated RBC%, automated 0.0 No Ref. Range %     Comment: Percent Reference Range Not Reported per accrediting agency    Absolute Nucleated RBC Count 0.00 0.00 - 0.00 x10E3/uL    Neutrophil Abs (Prelim) 3.52 See Absolute Neut Ct. x10E3/uL     Comment: This is a preliminary result.  If automated differential see Absolute Neut  Count or if manual differential see Absolute Neut Ct, Manual for final result.   Differential, Automated   Result Value Ref Range    Neutrophil Percent, Auto 59.2 No Ref. Range %  Comment: Percent reference range not reported per accrediting agency. Lymphocyte Percent, Auto 28.5 No Ref. Range %     Comment: Percent reference range not reported per accrediting agency.      Monocyte Percent, Auto 6.9 No Ref. Range %     Comment: Percent reference range not reported per accrediting agency.      Eosinophil Percent, Auto 4.4 No Ref. Range %     Comment: Percent reference range not reported per accrediting agency.      Basophil Percent, Auto 0.7 No Ref. Range %     Comment: Percent reference range not reported per accrediting agency.      Immature Granulocytes% 0.3 No Reference Range %     Comment: Percent reference range not reported per accrediting agency.      Absolute Neut Count 3.52 1.80 - 6.90 x10E3/uL    Absolute Lymphocyte Count 1.69 1.30 - 3.40 x10E3/uL    Absolute Mono Count 0.41 0.20 - 0.80 x10E3/uL    Absolute Eos Count 0.26 0.00 - 0.50 x10E3/uL    Absolute Baso Count 0.04 0.00 - 0.10 x10E3/uL    Absolute Immature Gran Count 0.02 0.00 - 0.04 x10E3/uL     Xr Chest Pa+lat (2 Views)    Result Date: 09/28/2018  IMPRESSION: No acute cardiopulmonary disease. Signed by: Princella Ion   09/28/2018 5:01 PM      Diagnosis  1. Preoperative clearance  Addendum:  Labs and CXR-reviewed. EKG shows NSRR. Patient is at low risk for surgery pending results.            2. Spinal stenosis of lumbar region, unspecified whether neurogenic claudication present   chronic. symptomatic with pain. reason for scheduled surgery                Assessment and pre-operative clearance  From my clinical perspective, I believe that the patient is at low risk for surgery pending labs.        The above plan of care, diagnosis, orders, and follow-up were discussed with the patient.  The patient had all questions answered satisfactorily and understands this recommended plan of care.  See AVS for additional information and counseling materials provided to the patient.     Author:  Clydene Pugh, DO 09/30/2018 11:12 AM

## 2018-09-29 LAB — APTT: APTT: 33 s (ref 24.4–36.2)

## 2018-09-29 LAB — Hgb A1c: HGB A1C - HPLC: 5.5 (ref <5.7)

## 2018-09-29 LAB — Prothrombin Time Panel: INR: 1 s (ref 11.5–14.4)

## 2018-09-29 LAB — UA,Microscopic: WBCS: 314 {cells}/uL — ABNORMAL HIGH (ref 0–22)

## 2018-09-29 LAB — UA,Dipstick: PROTEIN: NEGATIVE (ref 5.0–8.0)

## 2018-09-29 LAB — Vitamin B12: VITAMIN B12: 521 pg/mL (ref 254–1060)

## 2018-09-29 LAB — Differential Automated: ABSOLUTE LYMPHOCYTE COUNT: 1.69 10*3/uL (ref 1.30–3.40)

## 2018-09-29 LAB — Comprehensive Metabolic Panel
BILIRUBIN,TOTAL: 0.5 mg/dL (ref 0.1–1.2)
CHLORIDE: 101 mmol/L (ref 96–106)

## 2018-09-29 LAB — CBC: NUCLEATED RBC%, AUTOMATED: 0 (ref 9.3–13.0)

## 2018-09-29 LAB — Vitamin D,25-Hydroxy: VITAMIN D,25-HYDROXY: 17 ng/mL — ABNORMAL LOW (ref 20–50)

## 2018-09-30 ENCOUNTER — Ambulatory Visit: Payer: BLUE CROSS/BLUE SHIELD

## 2018-09-30 DIAGNOSIS — E559 Vitamin D deficiency, unspecified: Secondary | ICD-10-CM

## 2018-09-30 DIAGNOSIS — R311 Benign essential microscopic hematuria: Secondary | ICD-10-CM

## 2018-09-30 LAB — Bacterial Culture Urine: BACTERIAL CULTURE URINE: NO GROWTH

## 2018-09-30 MED ORDER — VITAMIN D2 (ERGOCALCIFEROL) 50000 UNITS PO CAPS
50000 [IU] | ORAL_CAPSULE | ORAL | 1 refills | Status: AC
Start: 2018-09-30 — End: ?

## 2018-12-28 IMAGING — MG MAMMO SCRN BIL W/CAD TOMO
8 series · 8 of 24 positions shown · non-contrast
Comparison: The present examination has been compared to prior imaging studies.

Images Obtained from [facility]
INDICATION: Screening.
TECHNIQUE: Bilateral 2-D digital screening mammogram was performed followed by 3-D tomosynthesis.  Current study was also evaluated with a computer aided detection (CAD) system.
MAMMOGRAM FINDINGS:
The breasts are heterogeneously dense, which may obscure small masses.
No suspicious abnormality is seen in either breast.

[R CC]
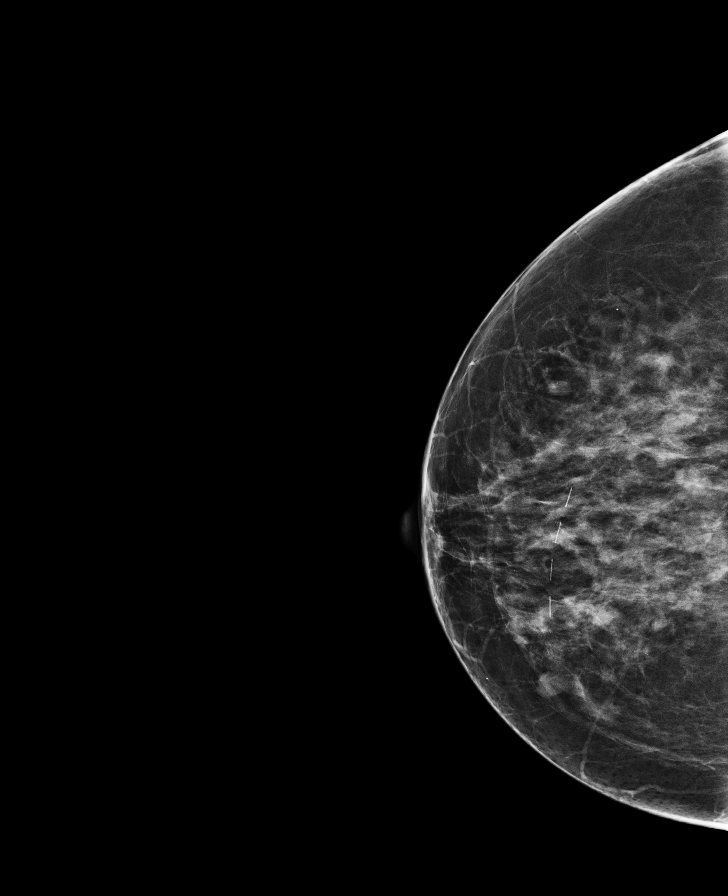

[R MLO]
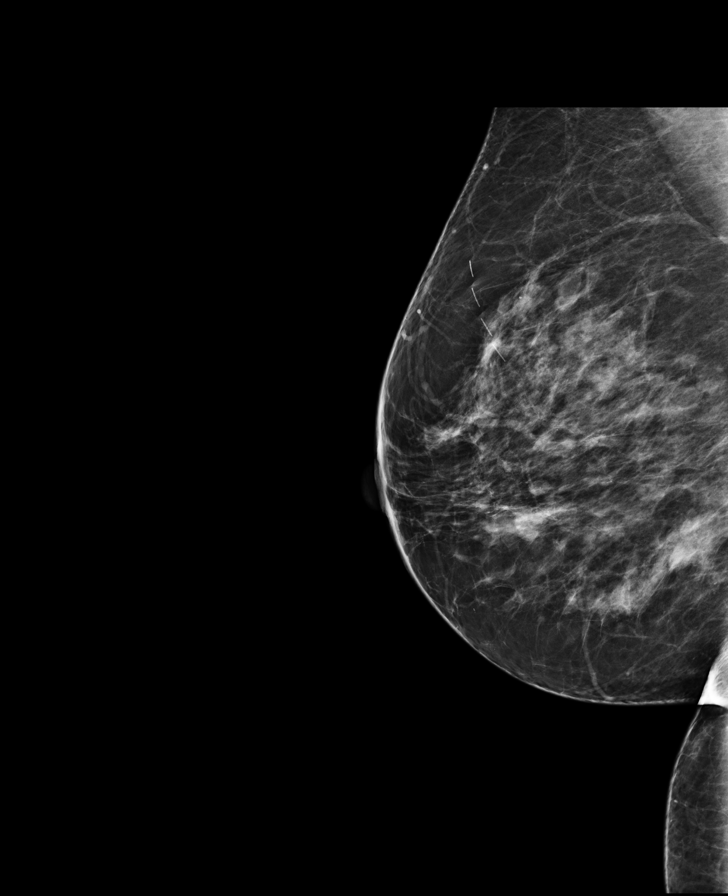

[L CC]
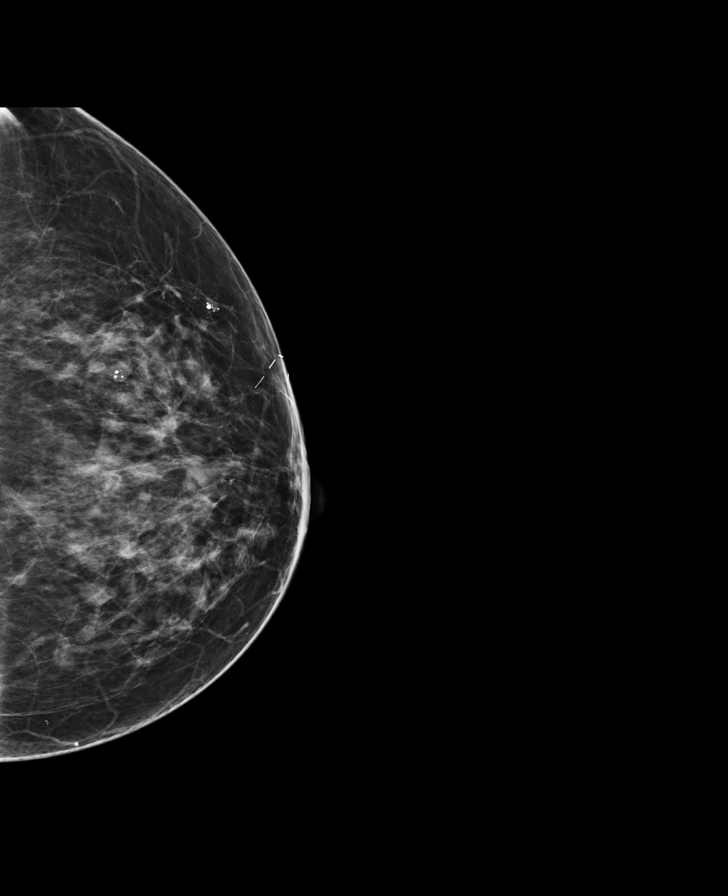

[L MLO]
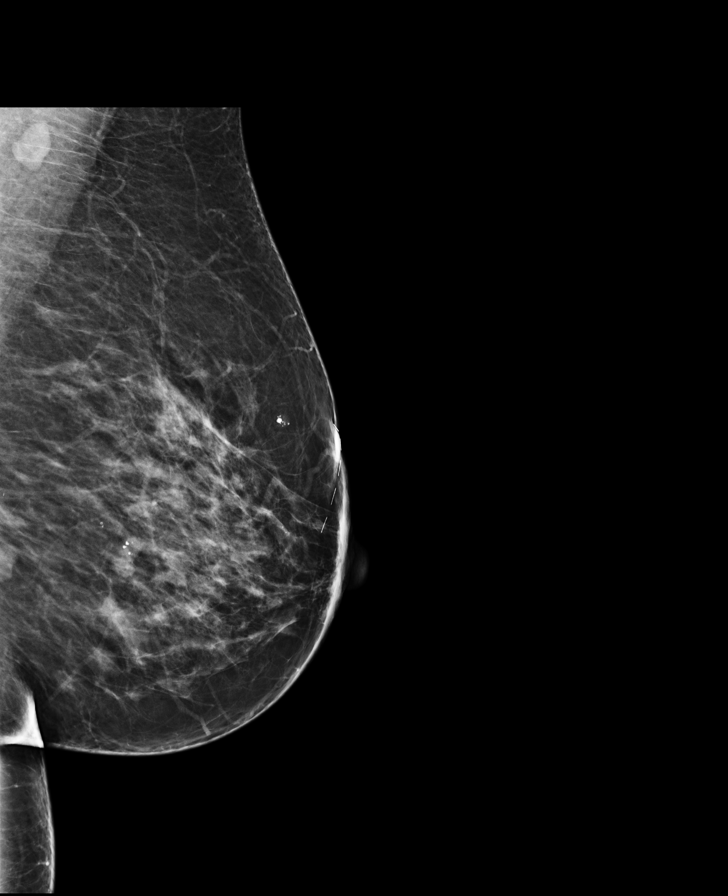

[L MLO tomo · tomo slice 45/89.0]
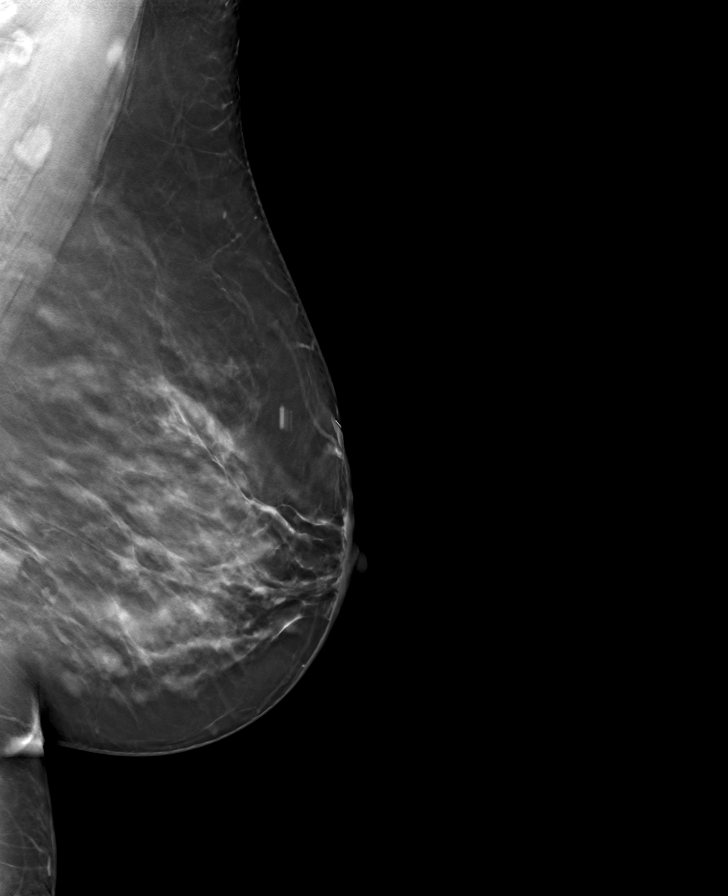

[L CC tomo · tomo slice 39/76.0]
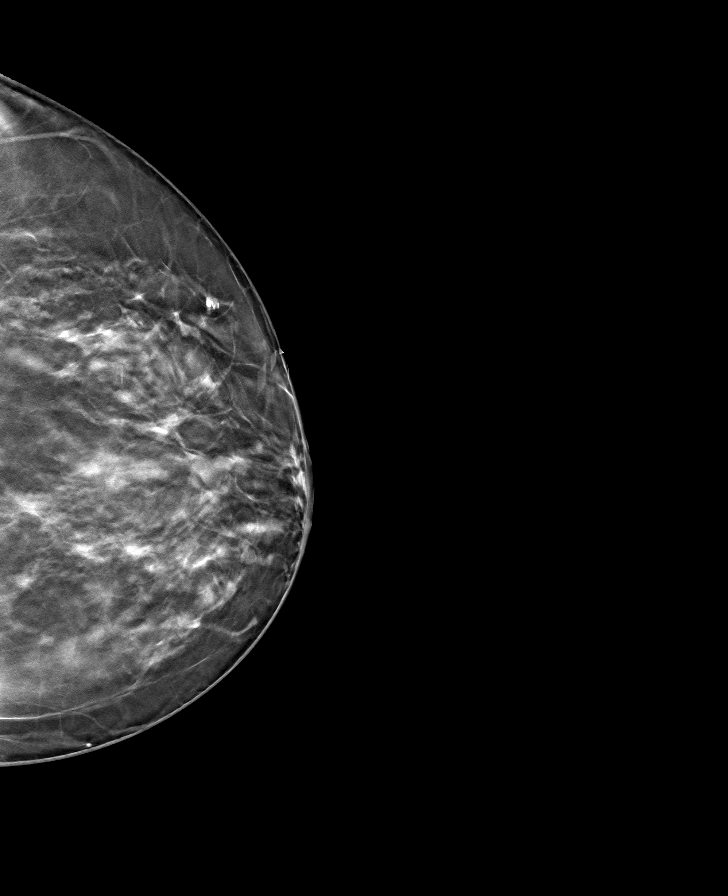

[R MLO tomo · tomo slice 42/83.0]
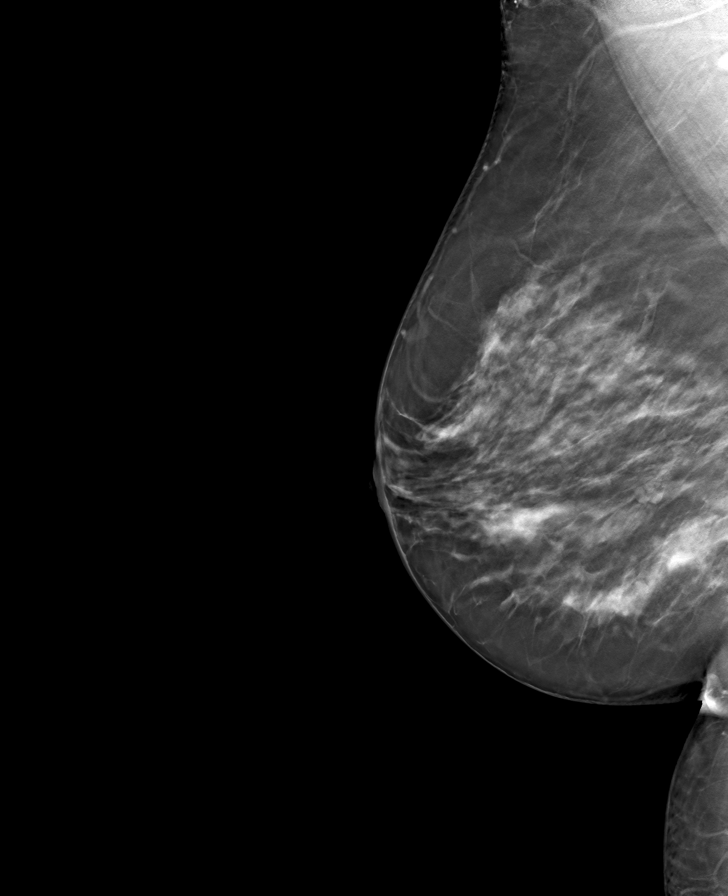

[R CC tomo · tomo slice 36/71.0]
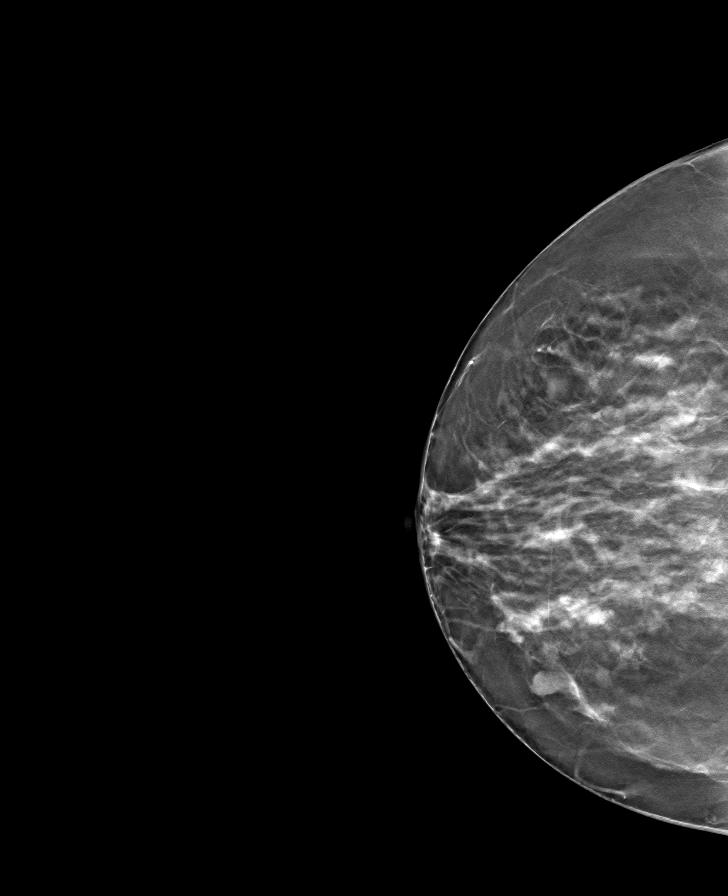

[8 of 24 positions shown; findings below may reference images not displayed]

IMPRESSION: There is no mammographic evidence of malignancy.
Screening mammogram recommended in 1 year.
BI-RADS Category 1: Negative

## 2019-09-07 ENCOUNTER — Ambulatory Visit: Payer: BLUE CROSS/BLUE SHIELD

## 2019-09-07 DIAGNOSIS — Z23 Encounter for immunization: Secondary | ICD-10-CM

## 2019-12-29 IMAGING — MG MAMMO SCRN BIL W/CAD TOMO
6 of 9 series · 6 of 25 positions shown · non-contrast
Comparison: The present examination has been compared to prior imaging studies.

Images Obtained from [facility]
INDICATION: Screening.
TECHNIQUE: Bilateral 2-D digital screening mammogram was performed followed by 3-D tomosynthesis.  Current study was also evaluated with a computer aided detection (CAD) system.
MAMMOGRAM FINDINGS:
The breasts are heterogeneously dense, which may obscure small masses.
Again seen are multiple oval circumscribed masses throughout both breasts, likely benign cysts.
No suspicious abnormality is seen in either breast.

[L MLO (1 of 2)]
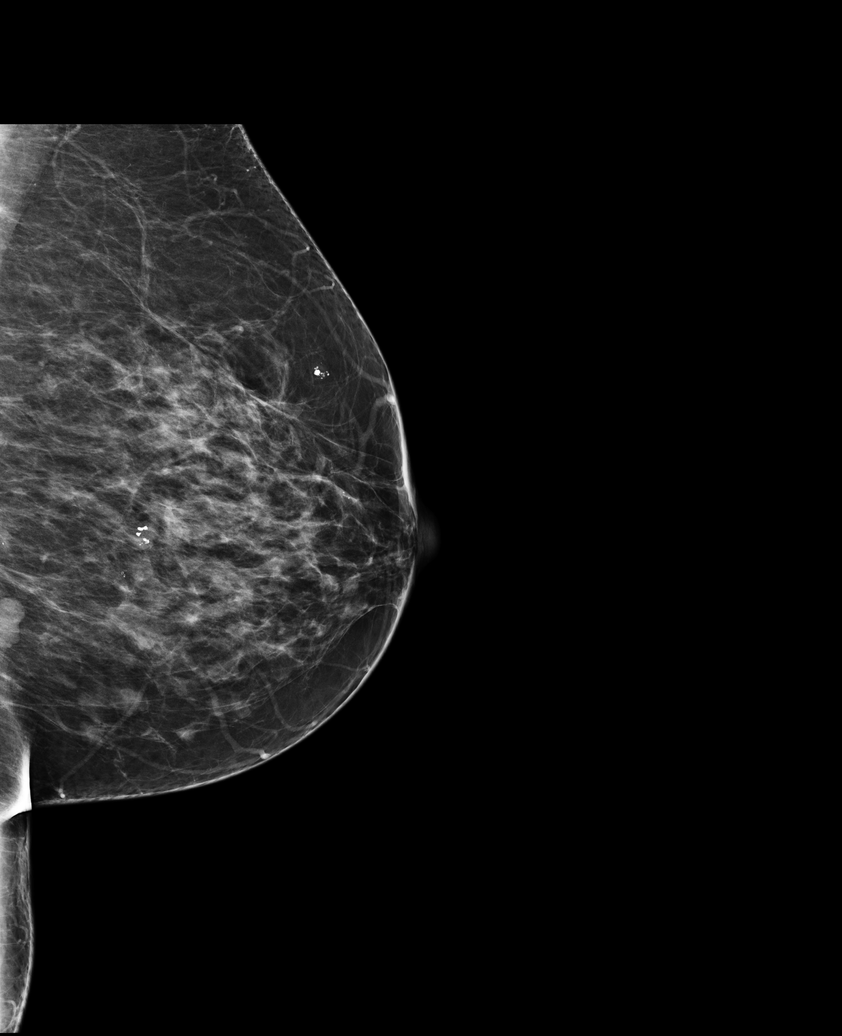

[L CC]
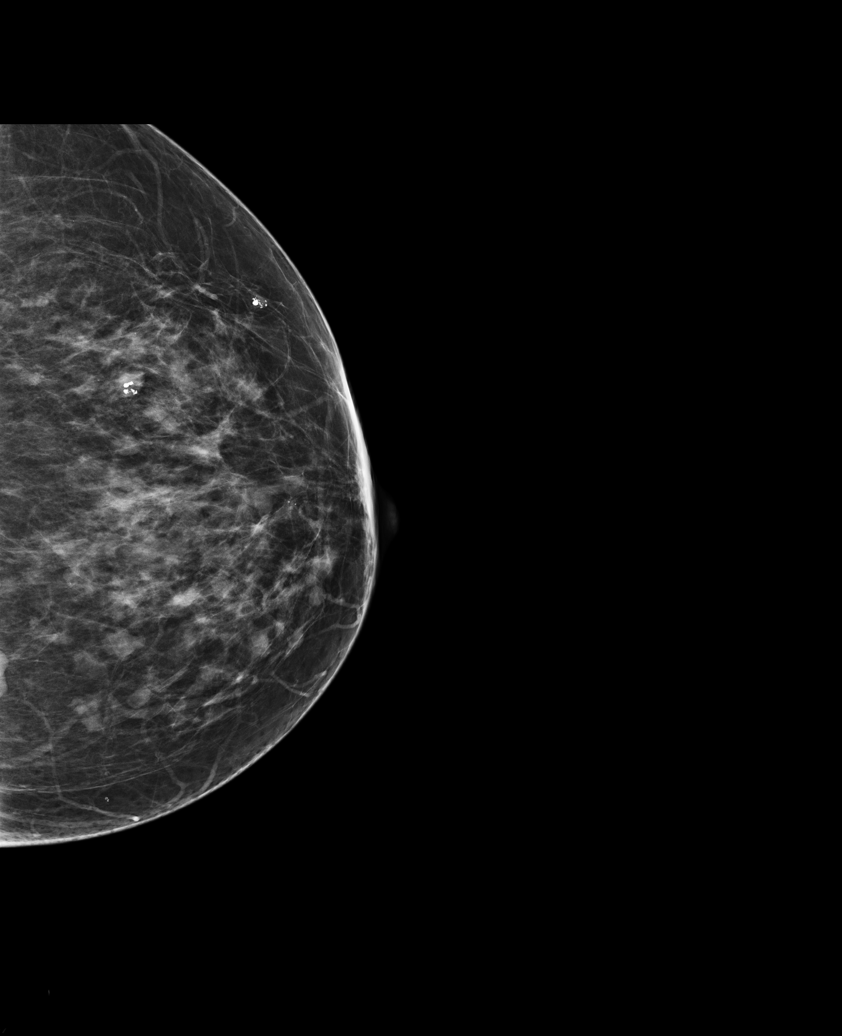

[R CC]
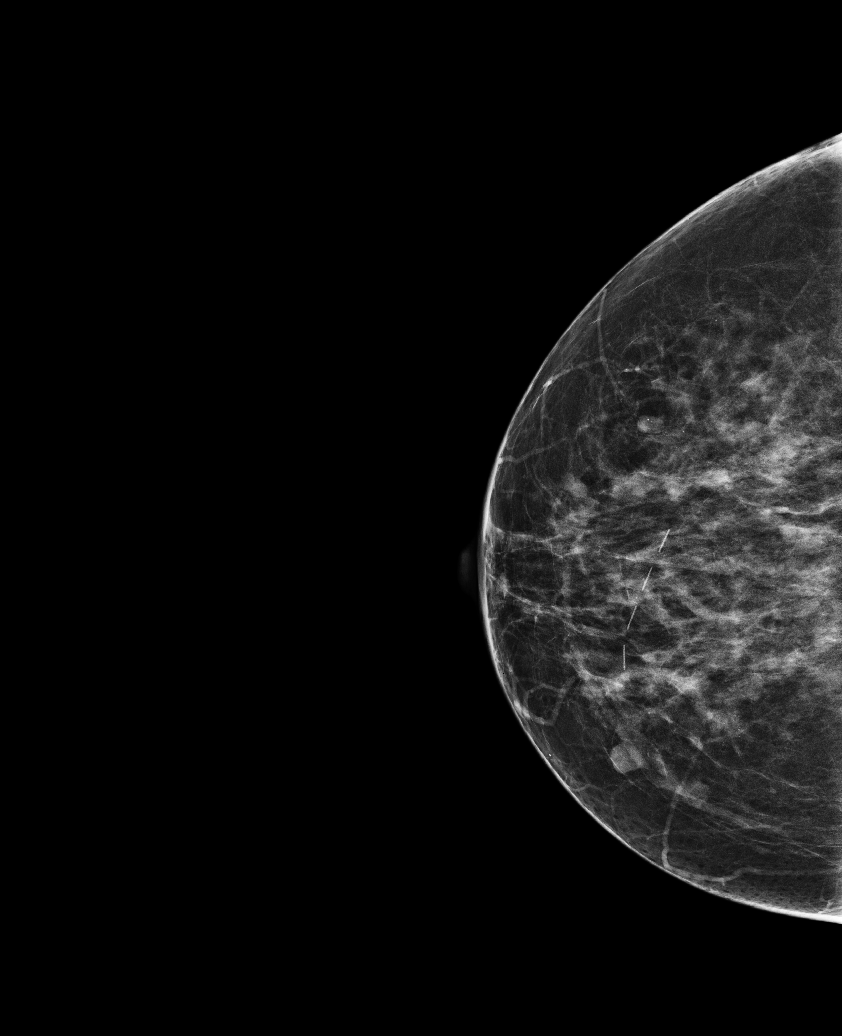

[L MLO (2 of 2)]
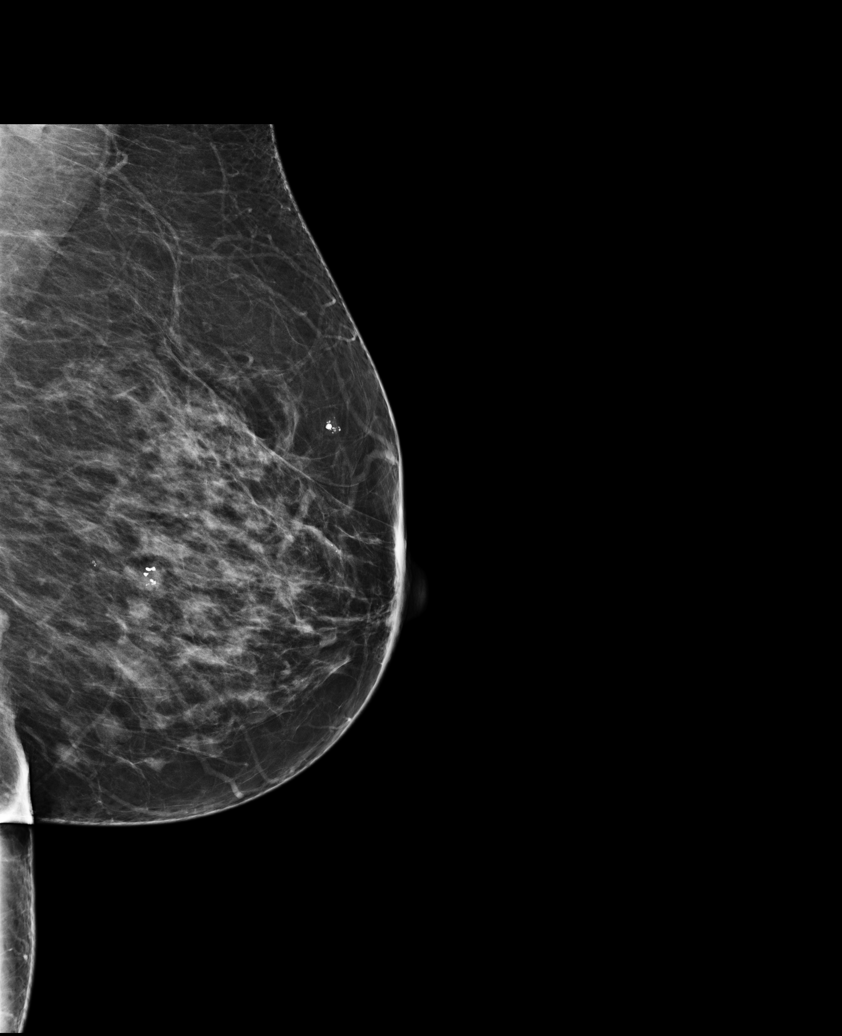

[R MLO]
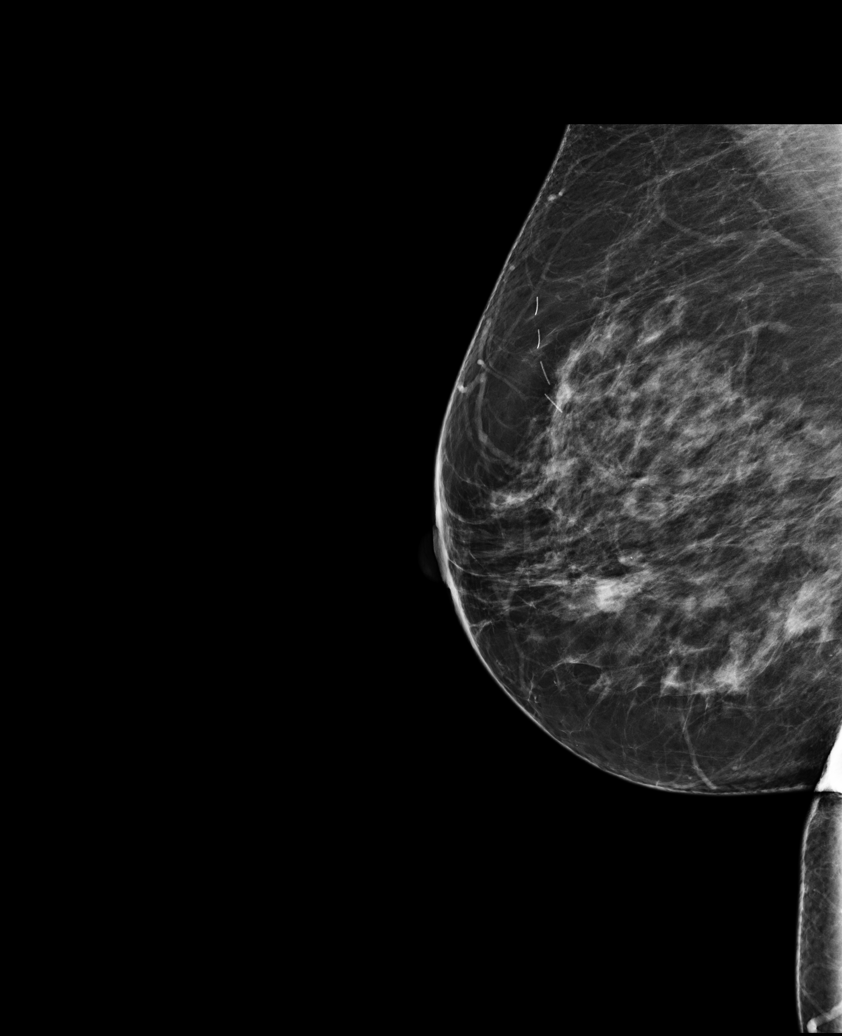

[L MLO tomo · tomo slice 43/86.0]
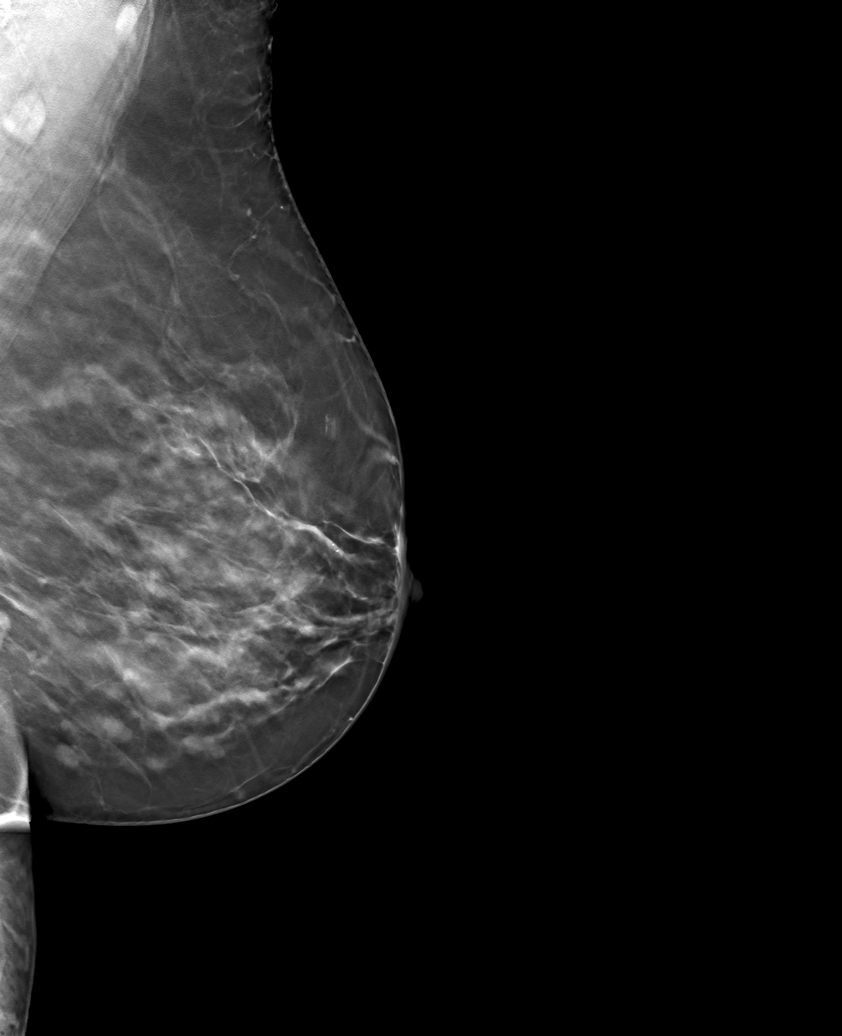

[6 of 25 positions shown; findings below may reference images not displayed]

IMPRESSION: There is no mammographic evidence of malignancy.
Screening mammogram recommended in 1 year.
BI-RADS Category 2: Benign

## 2021-02-05 IMAGING — MG MAMMO SCRN BIL W/CAD COMBO
6 of 10 series · 6 of 26 positions shown · non-contrast
Comparison: The present examination has been compared to prior imaging studies.

INDICATION: Screening.
TECHNIQUE: Bilateral 2-D digital screening mammogram was performed followed by 3-D tomosynthesis.  Current study was also evaluated with a computer aided detection (CAD) system.

MAMMOGRAM FINDINGS:
The breasts are heterogeneously dense, which may obscure small masses.
There are multiple circumscribed masses in both breasts, a benign finding and likely cysts.
No suspicious abnormality is seen in either breast.

[L MLO (1 of 2)]
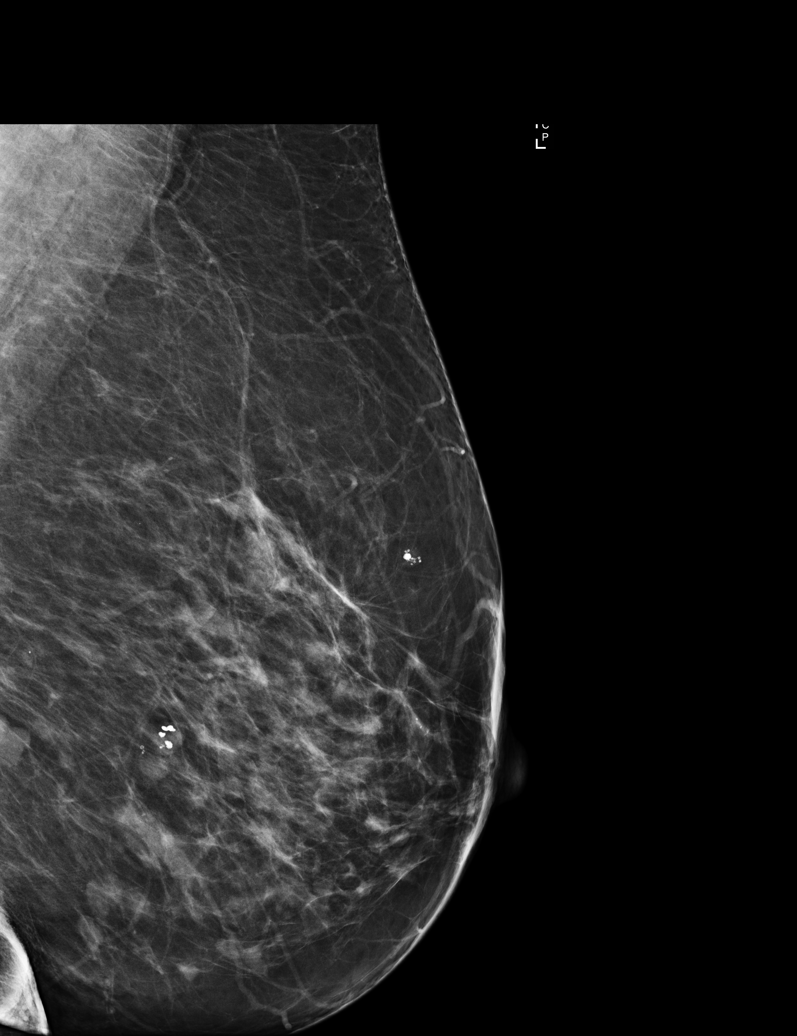

[R MLO (1 of 2)]
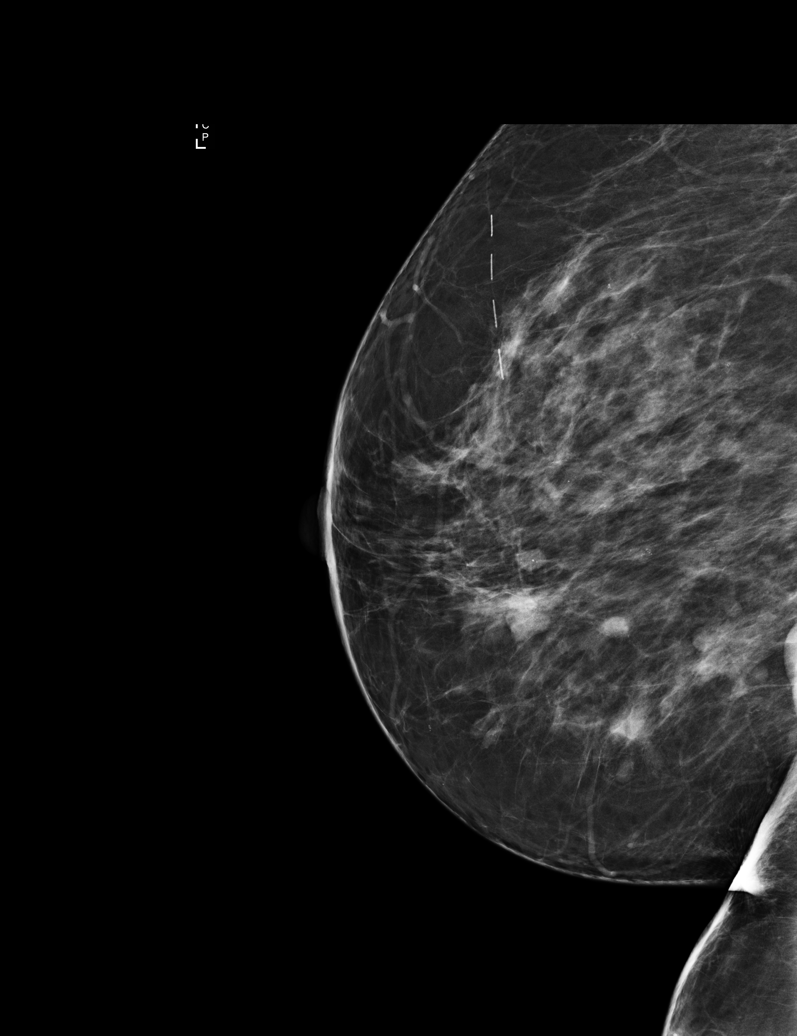

[L CC]
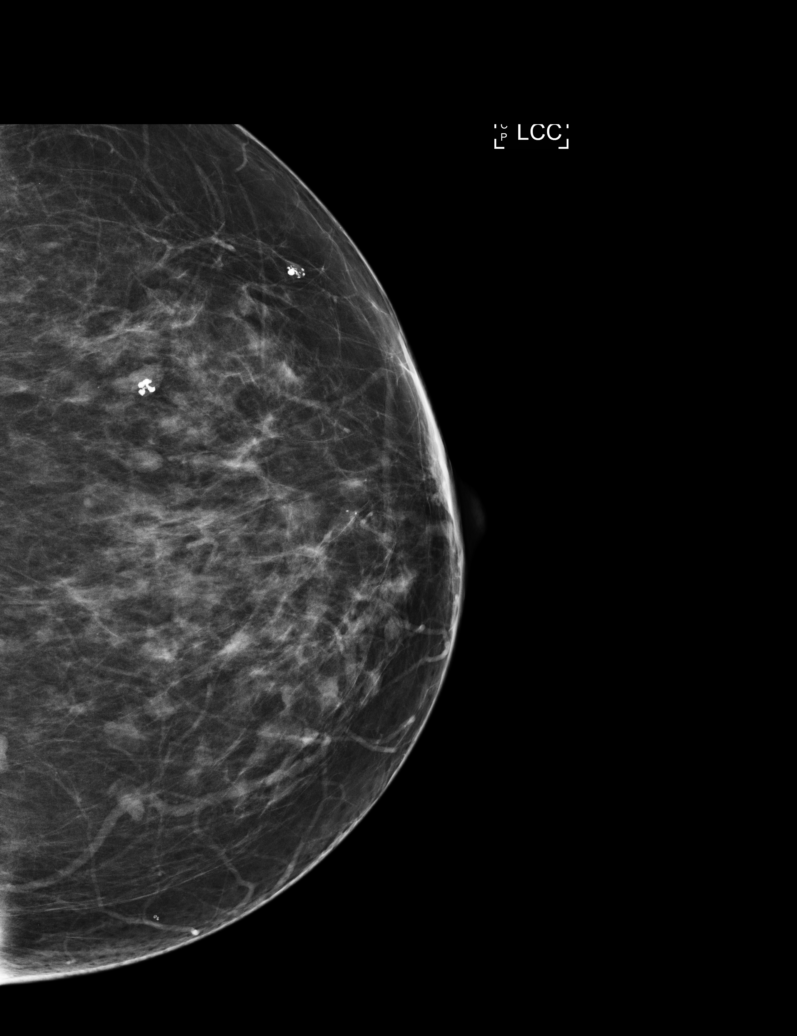

[R MLO (2 of 2)]
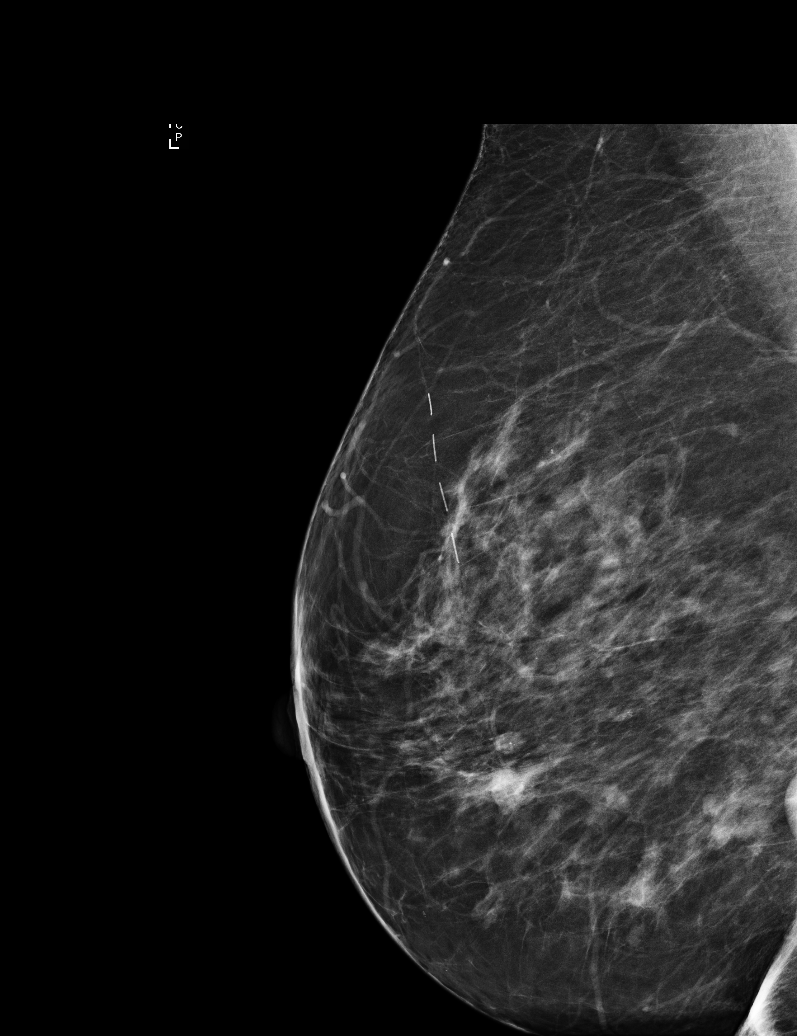

[R CC]
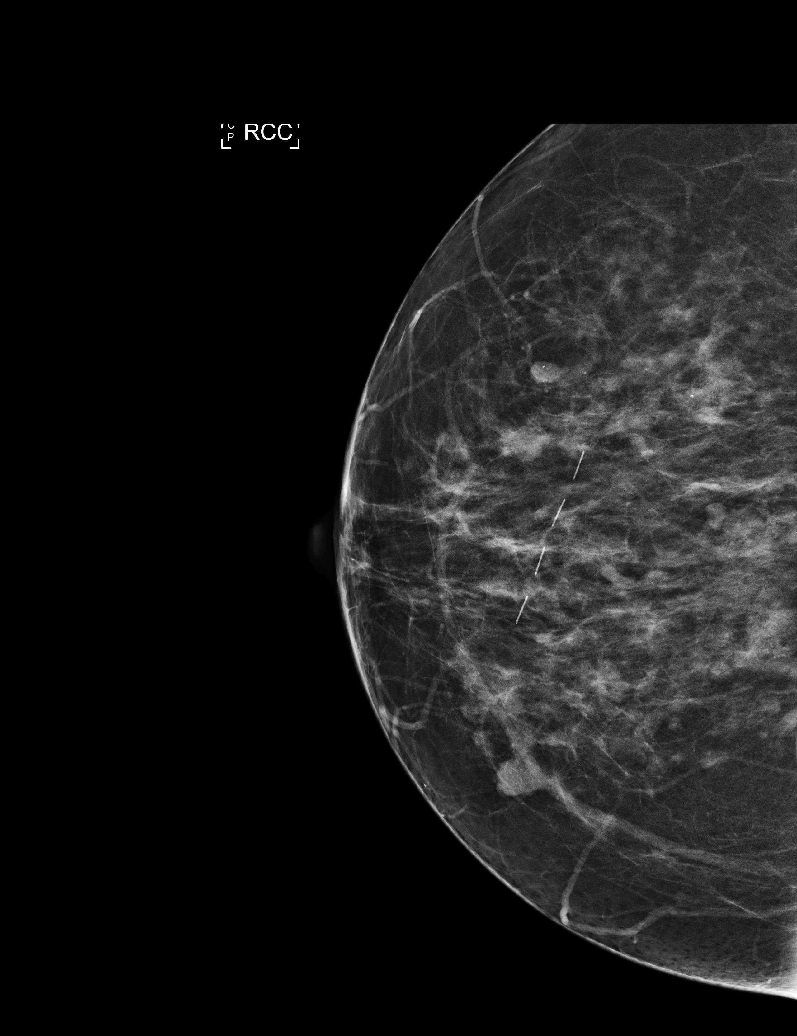

[L MLO (2 of 2)]
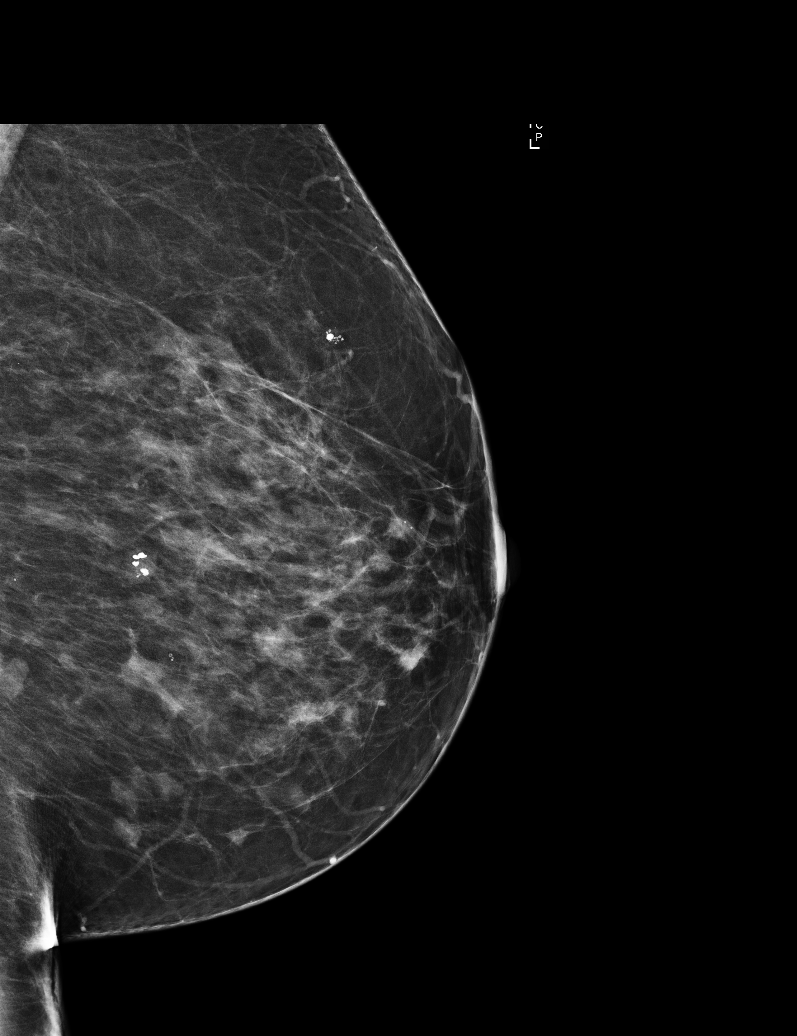

[6 of 26 positions shown; findings below may reference images not displayed]

IMPRESSION: There is no mammographic evidence of malignancy.

Screening mammogram recommended in 1 year.

BI-RADS Category 2: Benign

## 2021-02-05 IMAGING — MR MRI KNEE LT WO CONTRAST
5 of 6 series · 31 of 40 positions shown · non-contrast
Comparison: None.

INDICATION: Left knee pain
TECHNIQUE: Multiplanar, multiecho imaging of the left knee was performed, including T1-weighted and fluid sensitive sequences without intravenous contrast administration.

[Series 2: t2_axial_fs · axial · 4.0mm · 0.53mm/px · z∈[-42,+73]mm · 7 of 24 slices shown]
[im 1/24]
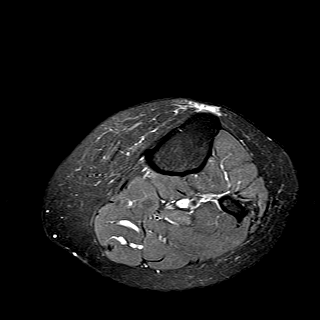
[im 4/24]
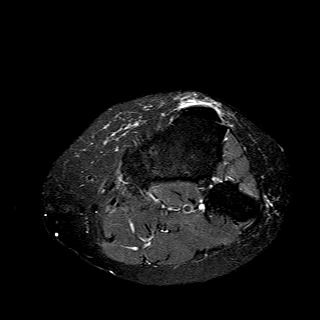
[im 8/24]
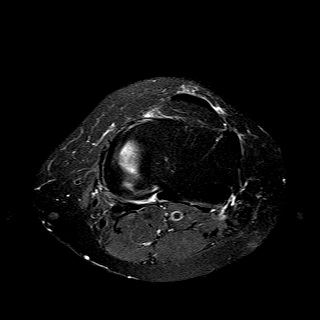
[im 12/24]
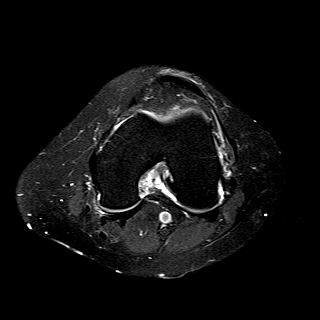
[im 16/24]
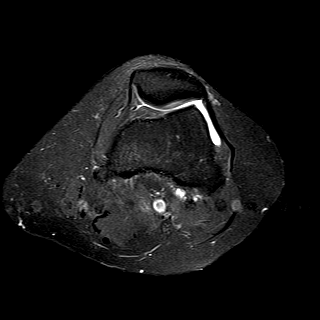
[im 20/24]
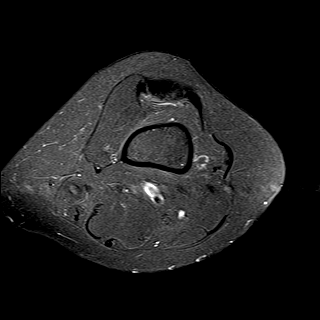
[im 24/24]
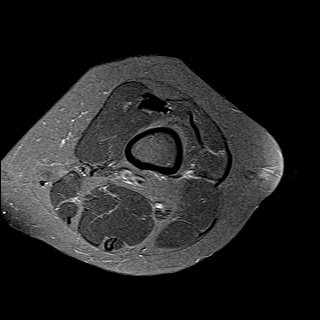

[Series 3: t1_cor · coronal · 4.0mm · 0.53mm/px · 5 of 18 slices shown]
[im 1/18]
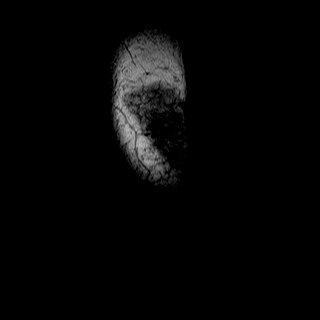
[im 5/18]
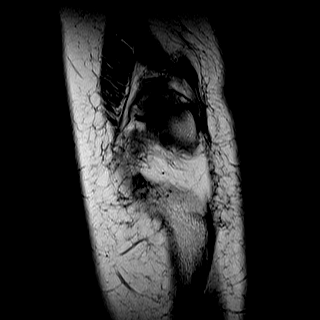
[im 9/18]
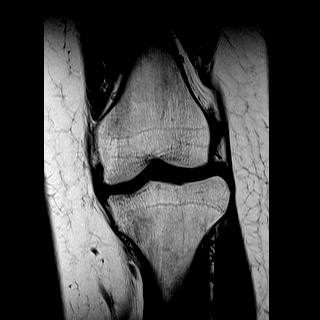
[im 13/18]
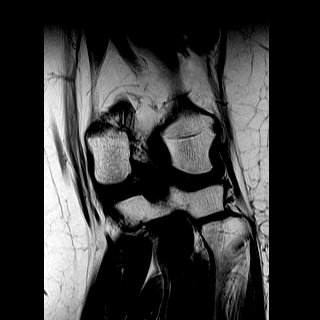
[im 18/18]
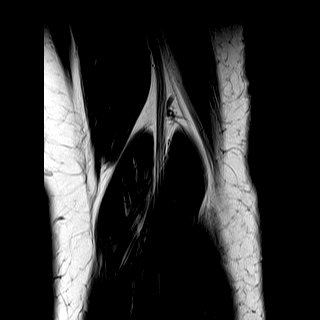

[Series 4: t2_cor_fs · coronal · 4.0mm · 0.53mm/px · 5 of 18 slices shown]
[im 1/18]
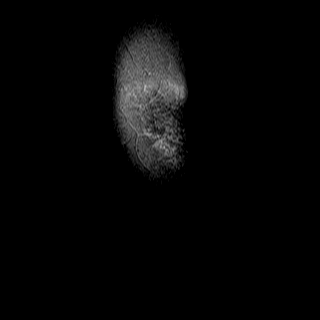
[im 5/18]
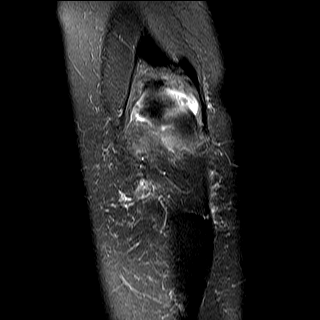
[im 9/18]
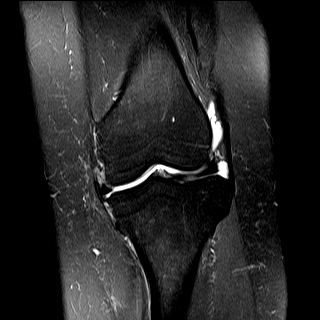
[im 13/18]
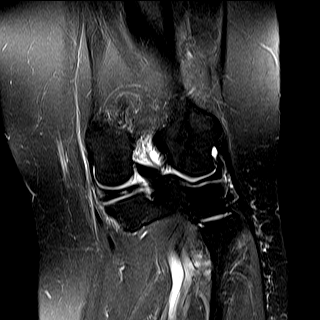
[im 18/18]
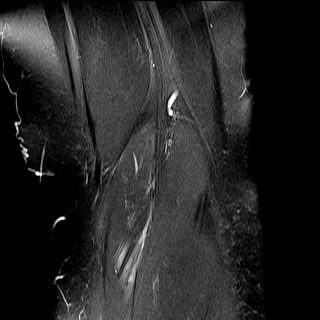

[Series 5: pd_sag_fs · sagittal · 3.0mm · 0.53mm/px · 7 of 25 slices shown]
[im 1/25]
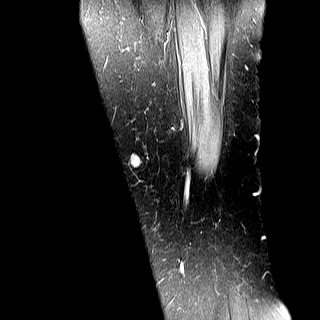
[im 5/25]
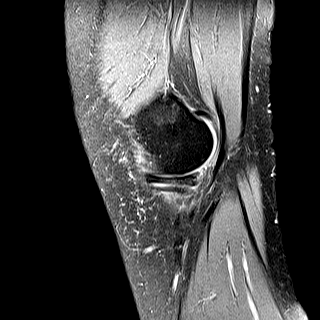
[im 9/25]
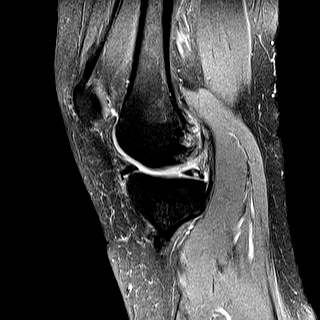
[im 13/25]
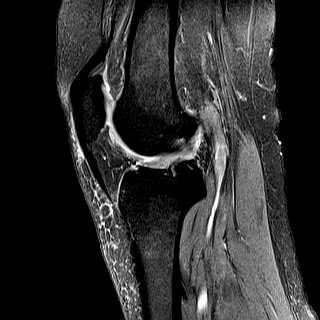
[im 17/25]
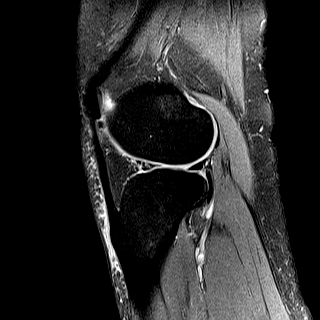
[im 21/25]
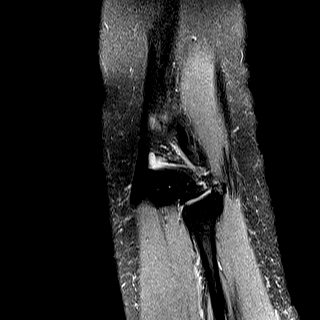
[im 25/25]
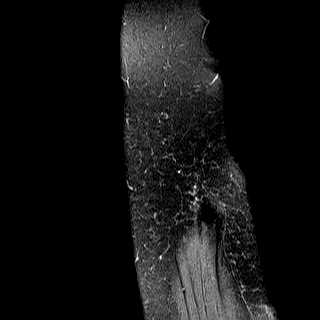

[Series 6: t2_sag_fs · sagittal · 3.0mm · 0.53mm/px · 7 of 25 slices shown]
[im 1/25]
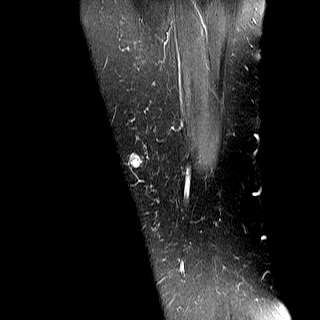
[im 5/25]
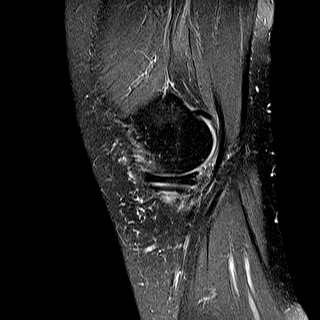
[im 9/25]
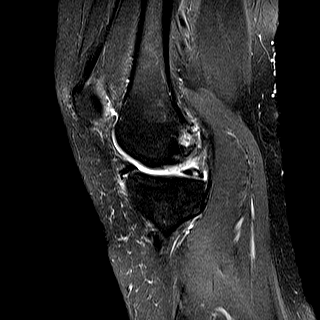
[im 13/25]
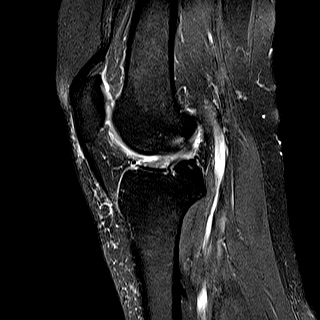
[im 17/25]
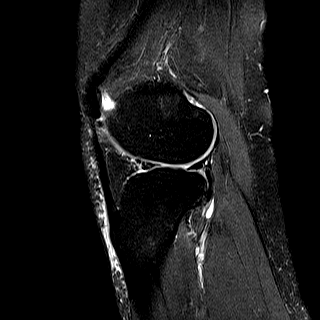
[im 21/25]
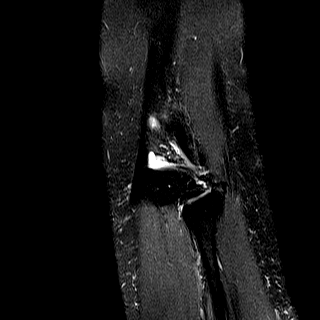
[im 25/25]
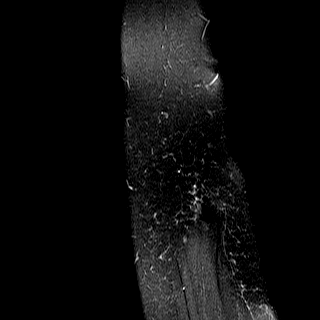

[31 of 40 positions shown; findings below may reference images not displayed]

FINDINGS: MEDIAL MENISCUS:  Large vertical undersurface tear of the posterior root. Moderate- sized radial tear of the posterior horn. Partial extrusion of the body into the gutter.

LATERAL MENISCUS:  Small radial tear of the body which is partially extruded. Degeneration of the anterior horn with small radial tear of the anterior horn.

ACL:  Intact.

PCL:  Intact.

MCL:  Intact.

LATERAL LIGAMENTS AND TENDONS:  Intact.

EXTENSOR MECHANISM:  The quadriceps and patellar tendons are intact.

FAT PADS:   Normal.

CARTILAGE:  

Patellofemoral compartment:  Intact.

Medial compartment:  Intact.

Lateral compartment: Intact.

BONE MARROW: Normal.

No joint effusion. No mass. No fluid collection. No Baker's cyst.
IMPRESSION: 1.
Moderate-sized horizontal tear of the posterior horn medial meniscus. Partial extrusion of the medial meniscus body.

2.
Small radial tear of the lateral meniscus body, which is partially extruded. Degeneration of the anterior horn lateral meniscus with small radial tear.

3.
Intact cartilage.

## 2021-02-12 IMAGING — MR MRI KNEE RT WO CONTRAST
5 of 6 series · 32 of 40 positions shown · non-contrast
Comparison: None.

INDICATION: Right knee pain.
TECHNIQUE: Multiplanar, multiecho imaging of the right knee was performed, including T1-weighted and fluid sensitive sequences without intravenous contrast administration.

[Series 2: t2_axial_fs · axial · 4.0mm · 0.53mm/px · z∈[-27,+77]mm · 6 of 22 slices shown]
[im 1/22]
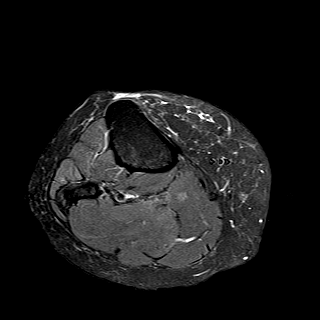
[im 5/22]
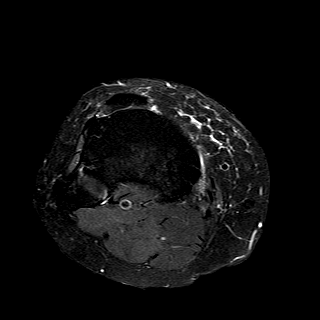
[im 9/22]
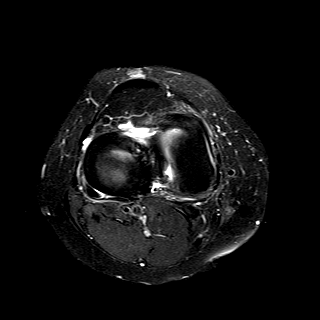
[im 13/22]
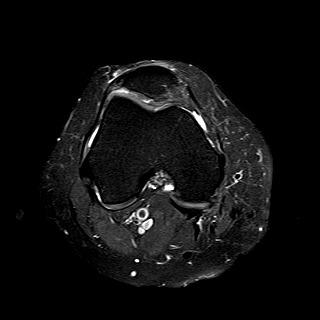
[im 17/22]
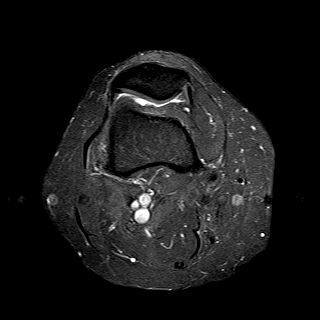
[im 22/22]
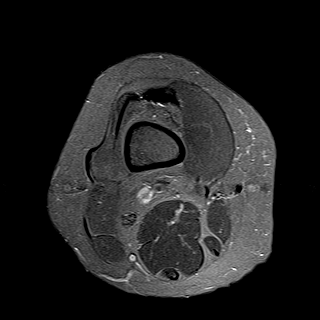

[Series 3: pd_sag_fs · sagittal · 3.0mm · 0.44mm/px · 7 of 26 slices shown]
[im 1/26]
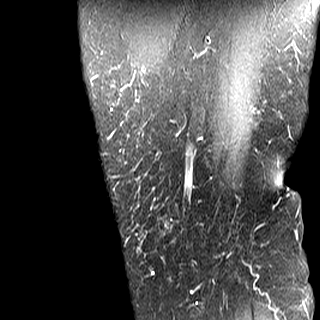
[im 5/26]
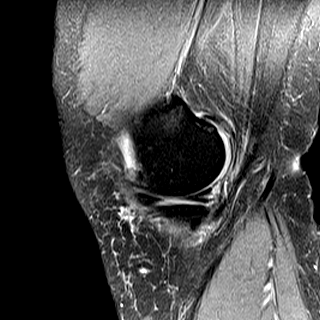
[im 9/26]
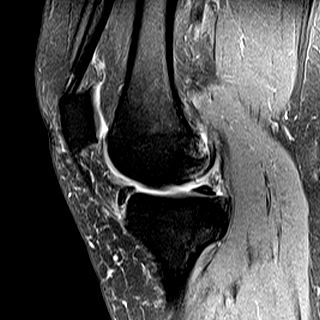
[im 13/26]
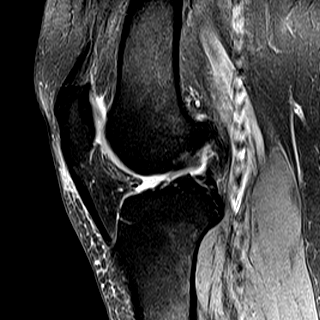
[im 17/26]
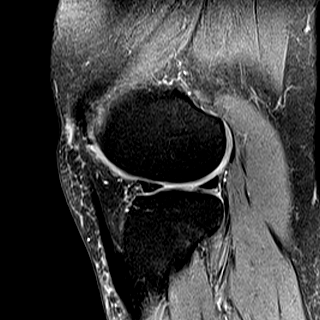
[im 21/26]
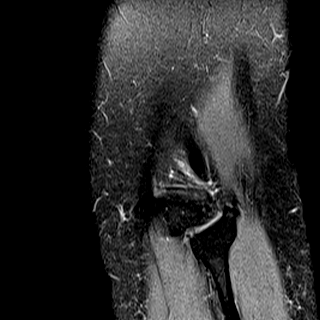
[im 26/26]
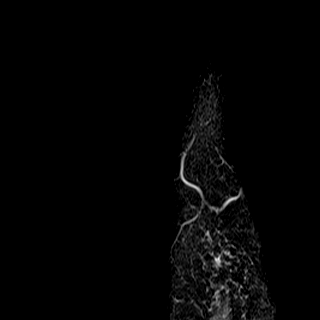

[Series 4: t2_sag_fs · sagittal · 3.0mm · 0.44mm/px · 7 of 26 slices shown]
[im 1/26]
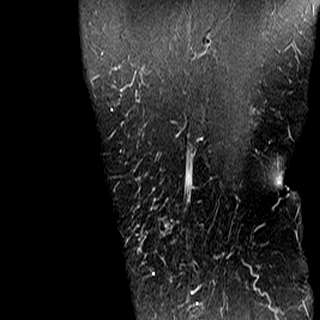
[im 5/26]
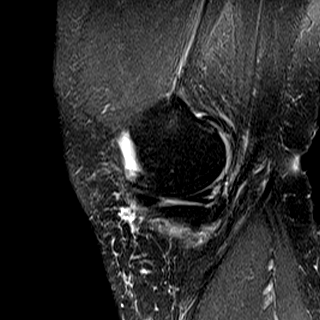
[im 9/26]
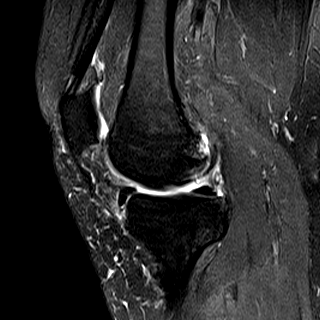
[im 13/26]
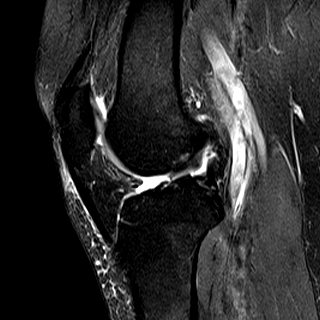
[im 17/26]
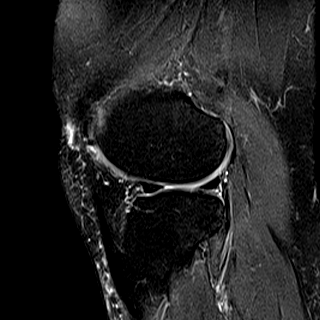
[im 21/26]
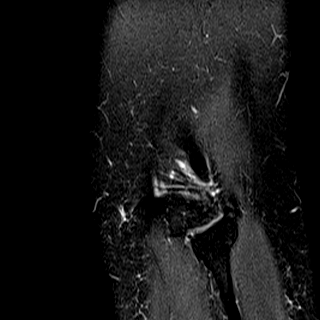
[im 26/26]
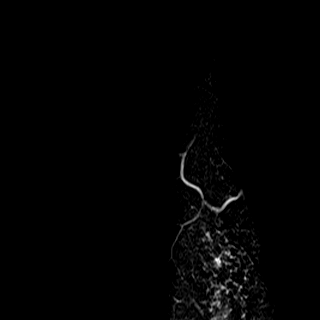

[Series 5: t1_cor · coronal · 4.0mm · 0.53mm/px · 6 of 20 slices shown]
[im 1/20]
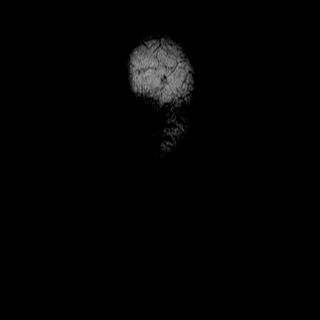
[im 4/20]
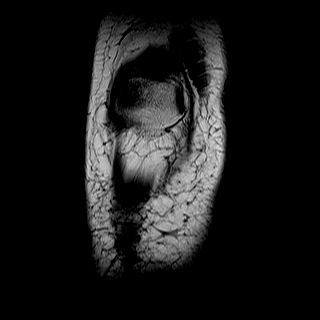
[im 8/20]
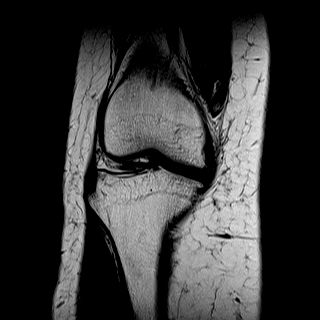
[im 12/20]
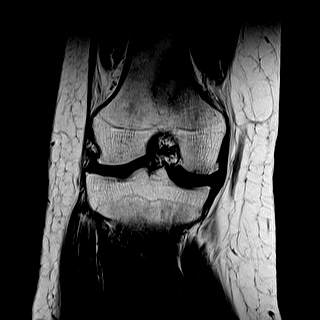
[im 16/20]
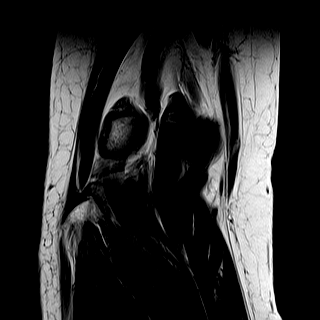
[im 20/20]
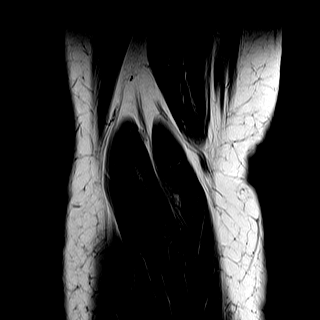

[Series 6: t2_cor_fs · coronal · 4.0mm · 0.53mm/px · 6 of 20 slices shown]
[im 1/20]
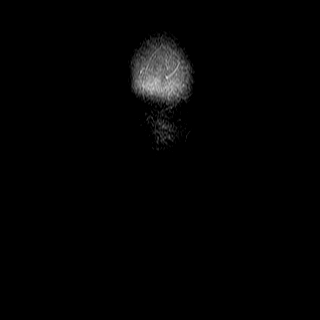
[im 4/20]
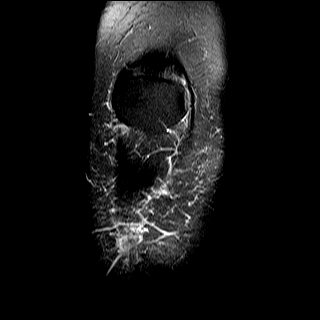
[im 8/20]
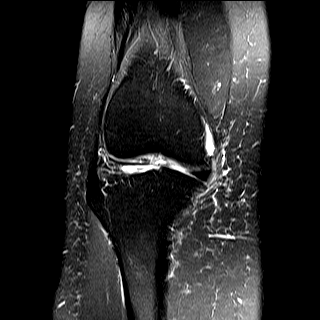
[im 12/20]
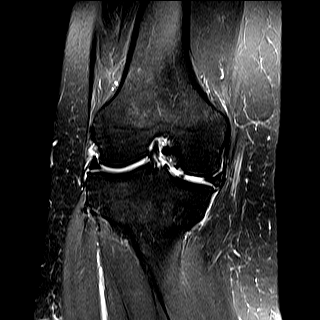
[im 16/20]
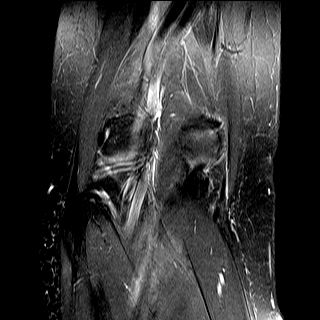
[im 20/20]
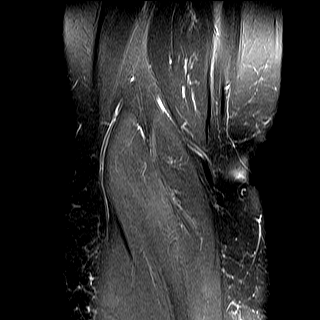

[32 of 40 positions shown; findings below may reference images not displayed]

FINDINGS: MEDIAL MENISCUS:  Oblique undersurface tear of the posterior horn, extending into the body. Small radial tear at the free edge of the body which is partially extruded.

LATERAL MENISCUS:  Tiny radial tear at the free edge which is partially extruded.

ACL:  Intact.

PCL:  Intact.

MCL:  Intact.

LATERAL LIGAMENTS AND TENDONS:  Intact.

EXTENSOR MECHANISM:  The quadriceps and patellar tendons are intact.

FAT PADS:   Normal.

CARTILAGE:  

Patellofemoral compartment:  Intact.

Medial compartment:  Scattered chondral surface irregularity and thinning.

Lateral compartment: Intact.

BONE MARROW: Normal.

No joint effusion.
IMPRESSION: 1.
Oblique undersurface tear of the posterior horn medial meniscus, extending into the body. Small radial tear at the free edge of the medial meniscus body, which is partially extruded.

2.
Tiny radial tear at the free edge of the lateral meniscus, which is partially extruded.

3.
Mild medial compartment chondral abnormalities.

## 2021-10-03 IMAGING — CR CHEST 2 VWS PA LAT
2 series · 2 of 2 positions shown · non-contrast
Comparison: None

HISTORY: Cough
TECHNIQUE: PA and lateral chest radiographs

[w chest pa]
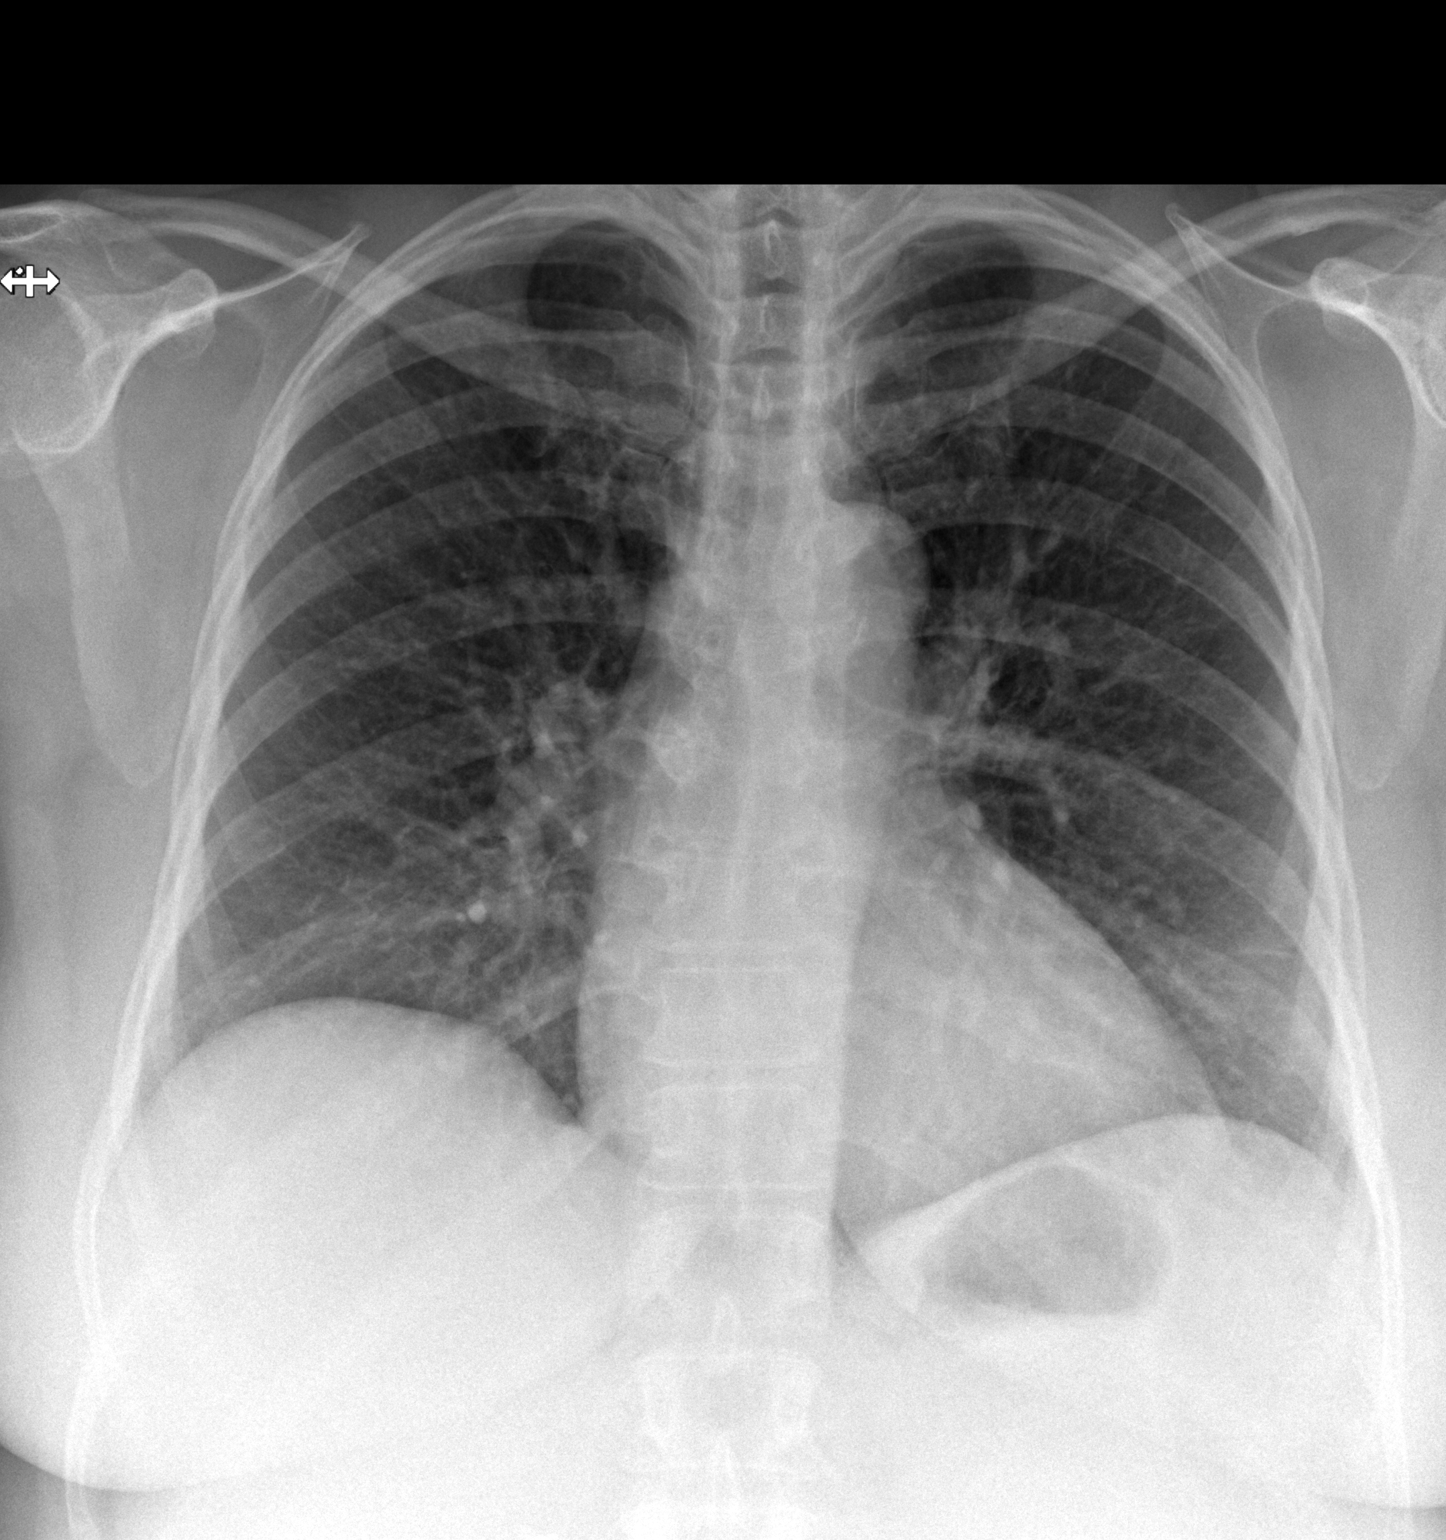

[w chest lat]
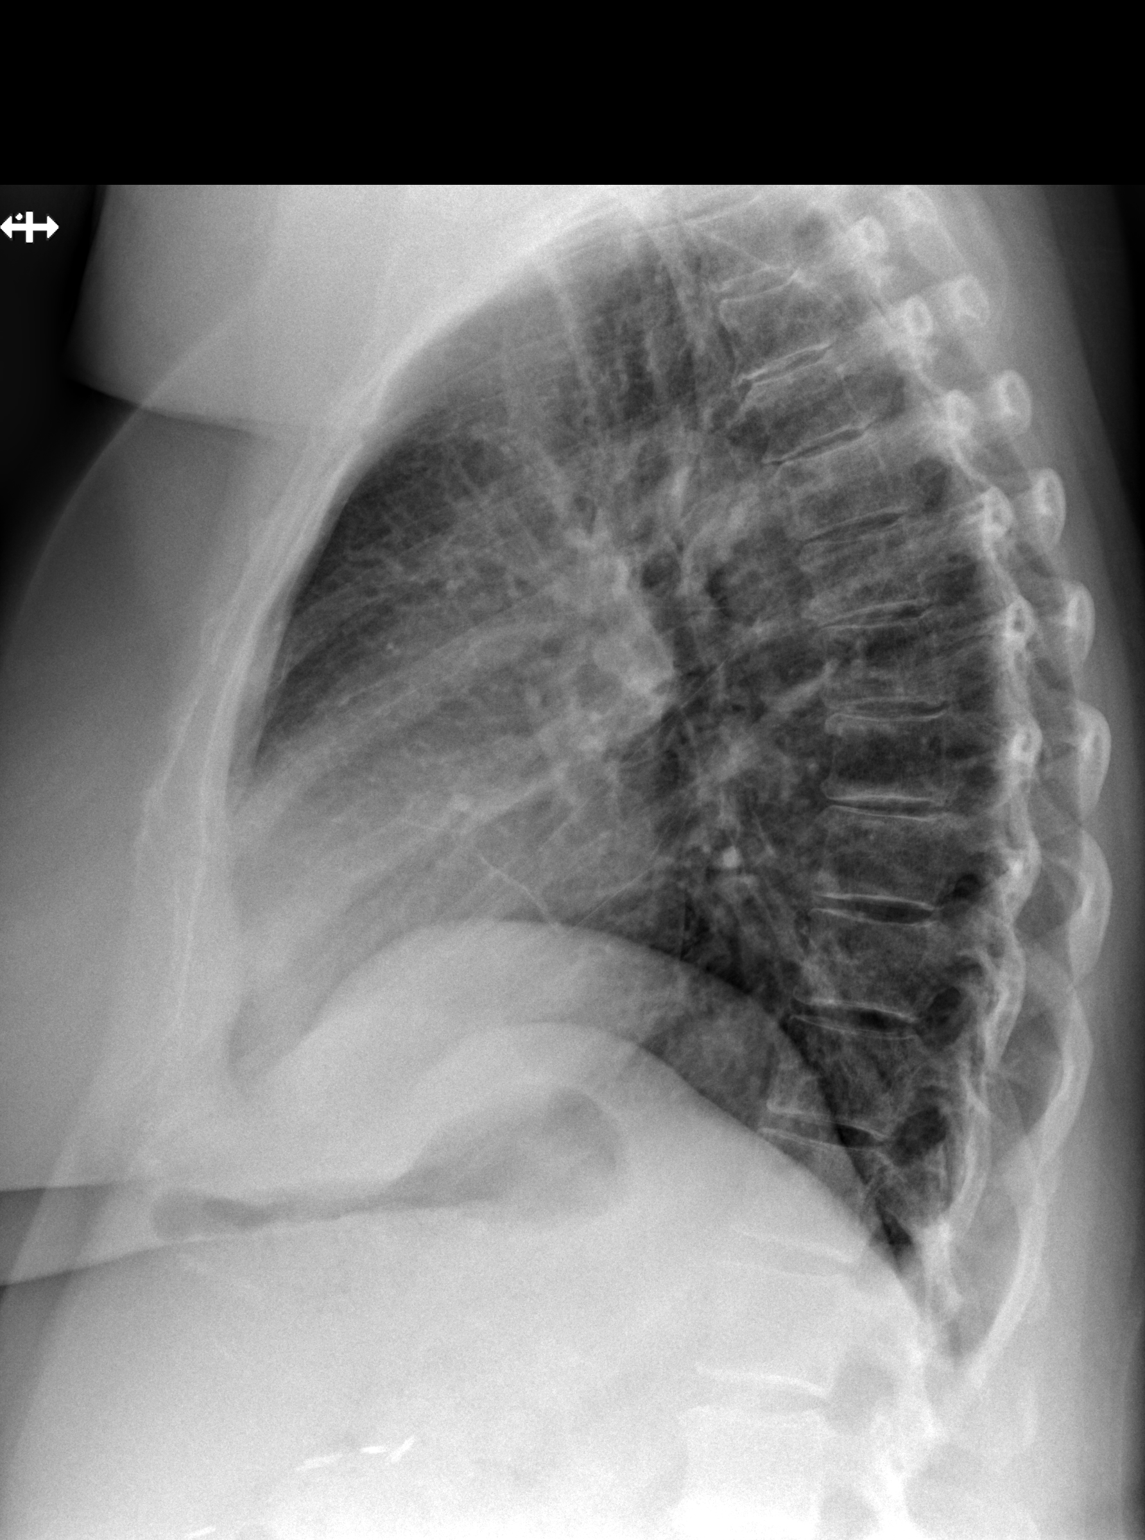

[2 of 2 positions shown; findings below may reference images not displayed]

FINDINGS: Heart size is normal. No pleural effusion or pneumothorax. No consolidation.
IMPRESSION: No acute chest findings.

## 2021-12-19 IMAGING — US US SOFT TISSUE LOWER EXT  LT
1 series · 8 of 8 positions shown · non-contrast
Comparison: None.

Images Obtained from [facility]
HISTORY: 61-year-old female with left lower knee swelling/ mass.
TECHNIQUE: Using real-time and Doppler ultrasound, patient's area of interest in the left knee was evaluated.

[Series 1: us soft tissue lower ext left · 8 of 8 slices shown]
[im 1/8]
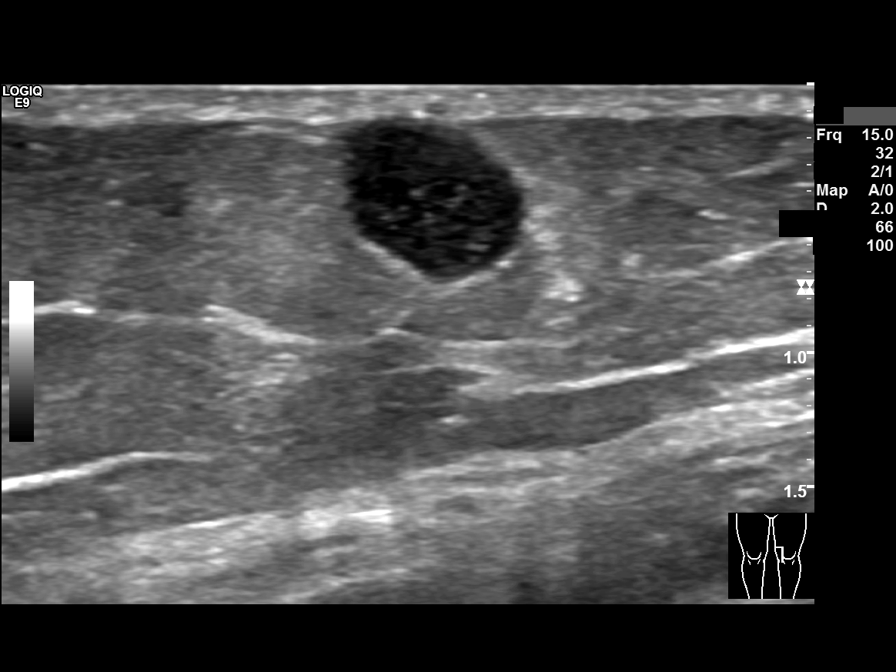
[im 2/8]
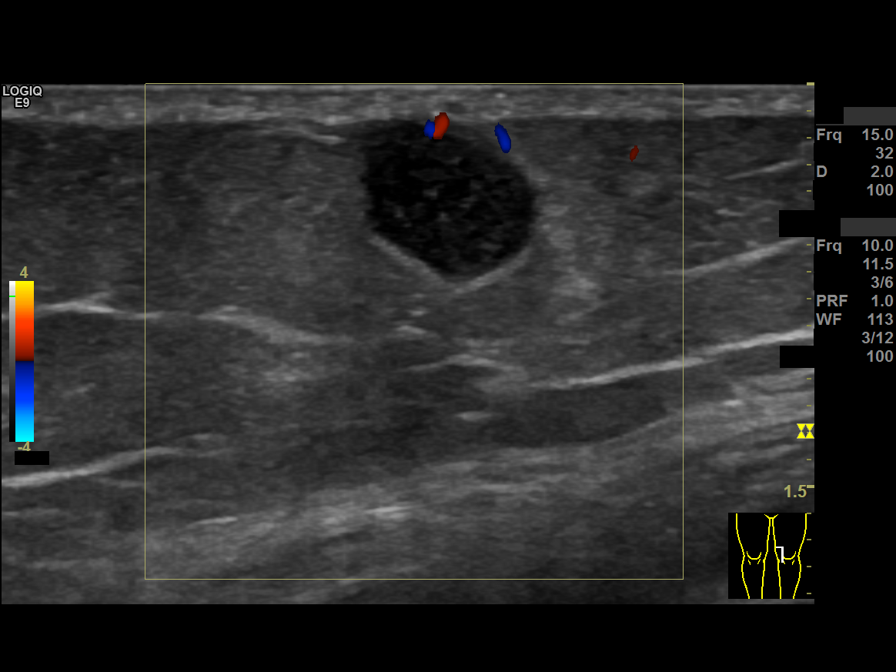
[im 3/8]
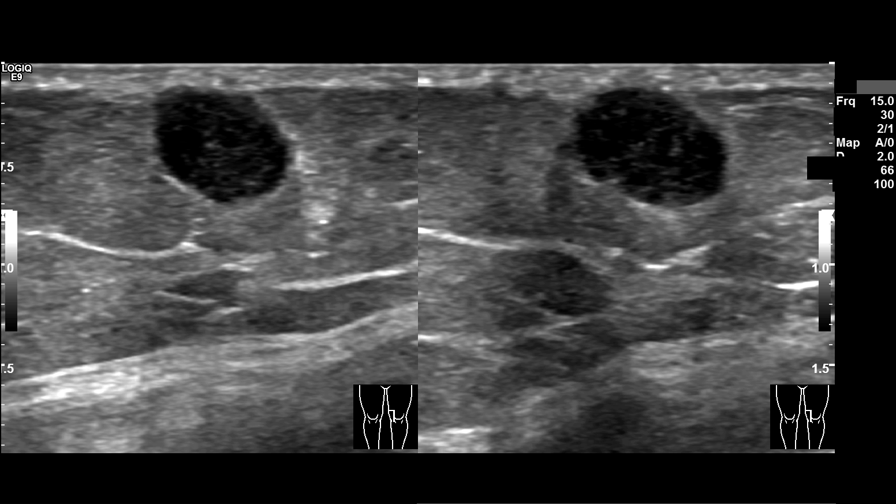
[im 4/8]
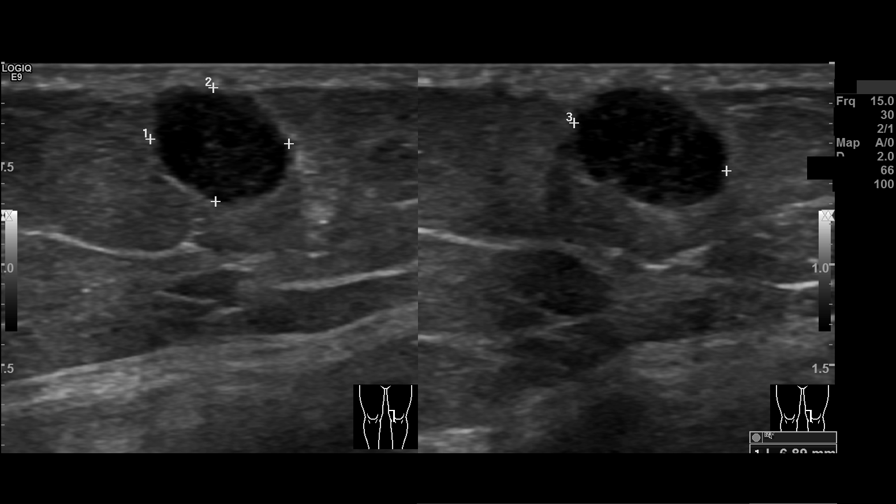
[im 5/8]
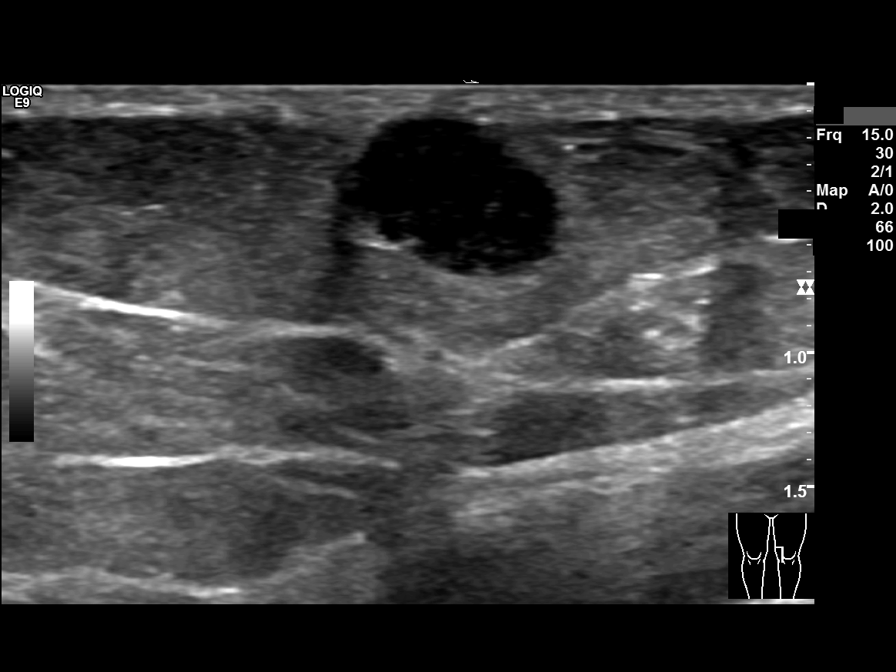
[im 6/8]
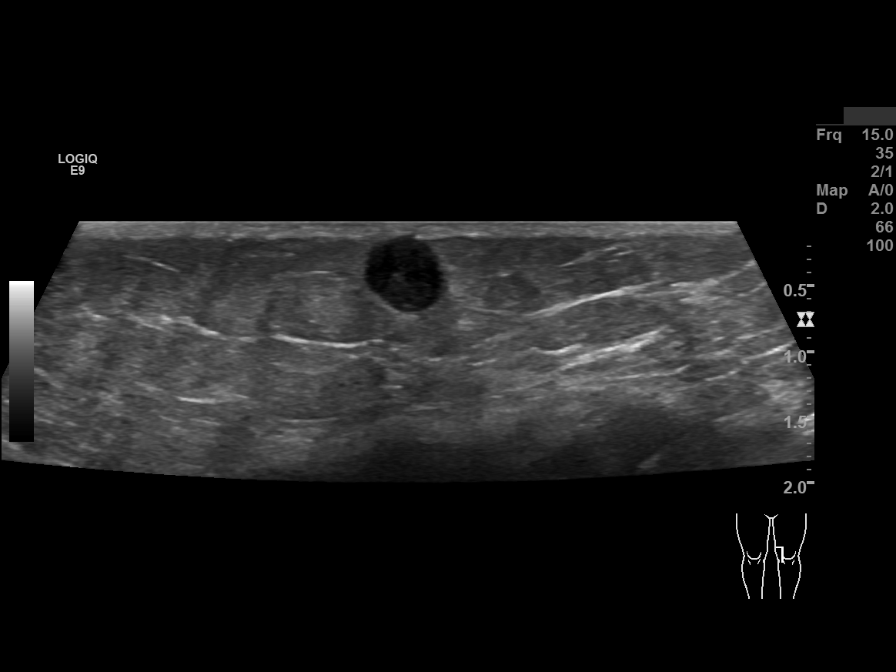
[im 7/8]
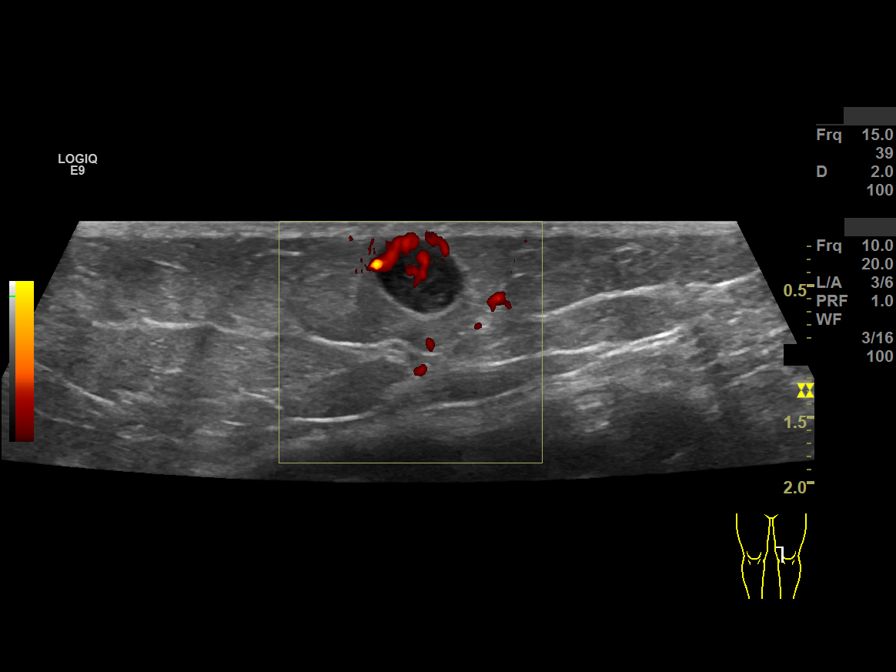
[im 8/8]
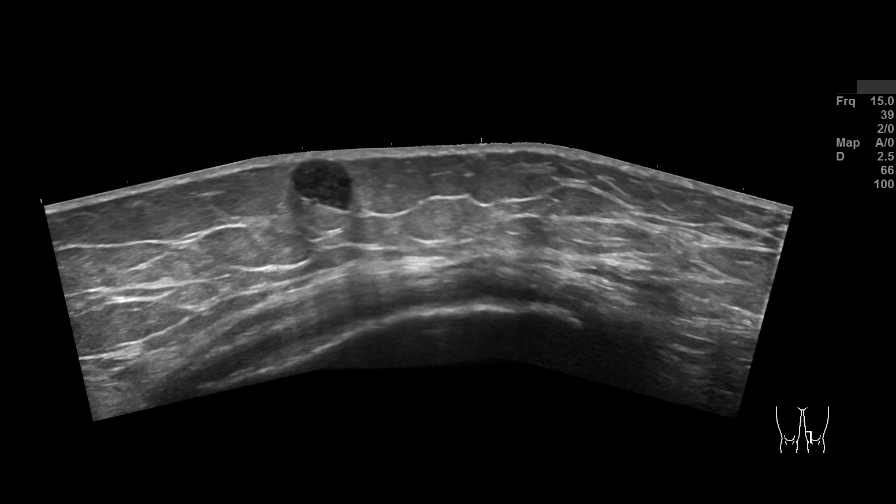

[8 of 8 positions shown; findings below may reference images not displayed]

FINDINGS: 7 x 6 x 8 mm very hypoechoic soft tissue nodule present with the vascularity.
IMPRESSION: 7 x 6 x 8 mm soft tissue very hypoechoic nodule with vascularity in the left knee corresponding to patient's palpable abnormality. Additional MRI evaluation is recommended.

## 2021-12-30 IMAGING — MR MRI KNEE LT WO CONTRAST
4 of 6 series · 17 of 40 positions shown · non-contrast
Comparison: MRI of the left knee, 02/05/2021.

Images Obtained from [facility]
INDICATION: Pain in left knee, localized swelling, mass and lump left lower limb. Per patient, history of surgery with meniscus repair [DATE].
TECHNIQUE: Multiplanar, multisequence imaging of the left knee was performed without contrast.

[Series 2: t2_axial_fs · axial · 4.0mm · 0.23mm/px · z∈[-48,+57]mm · 6 of 22 slices shown]
[im 1/22]
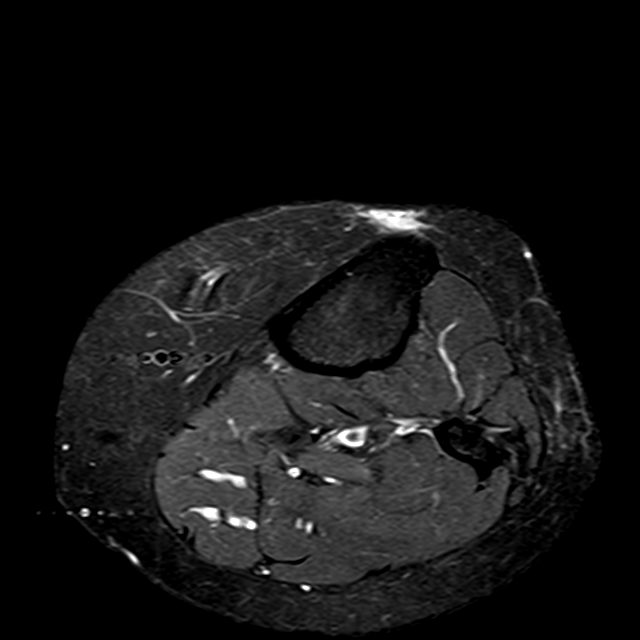
[im 5/22]
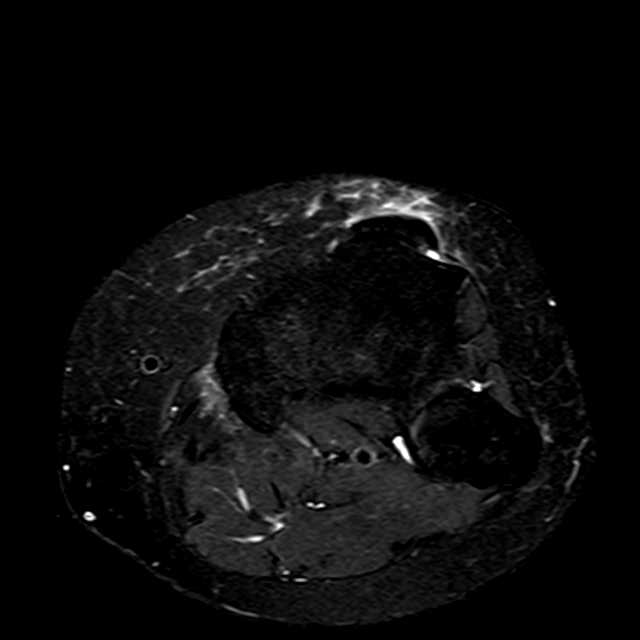
[im 9/22]
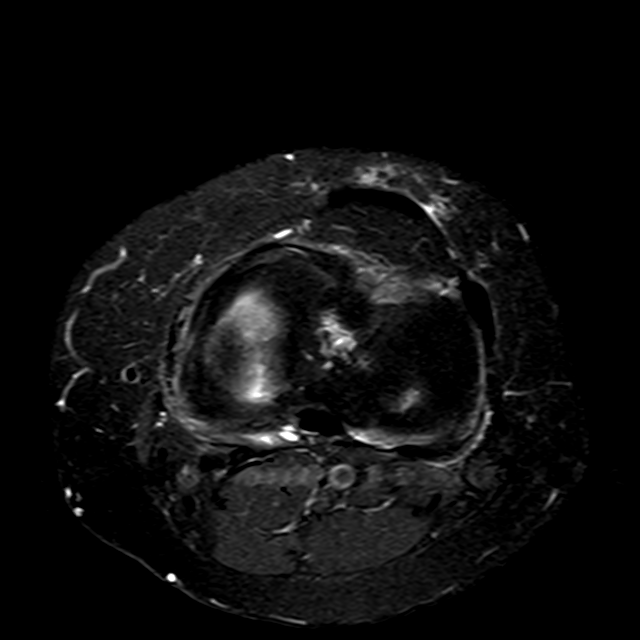
[im 13/22]
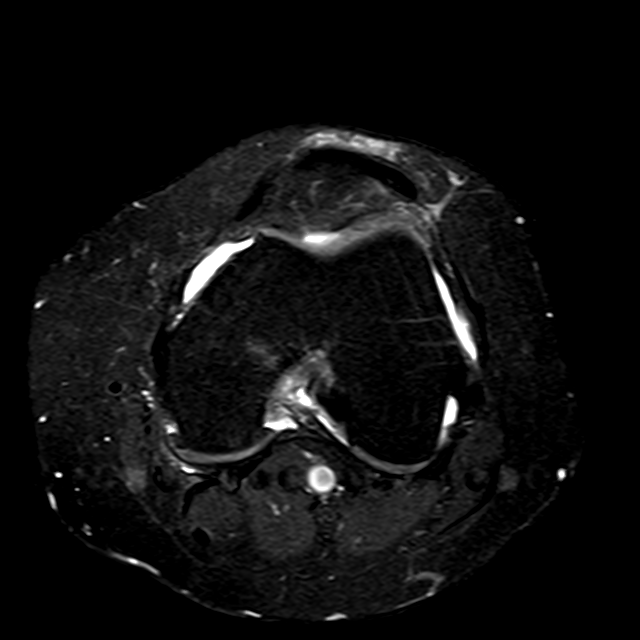
[im 17/22]
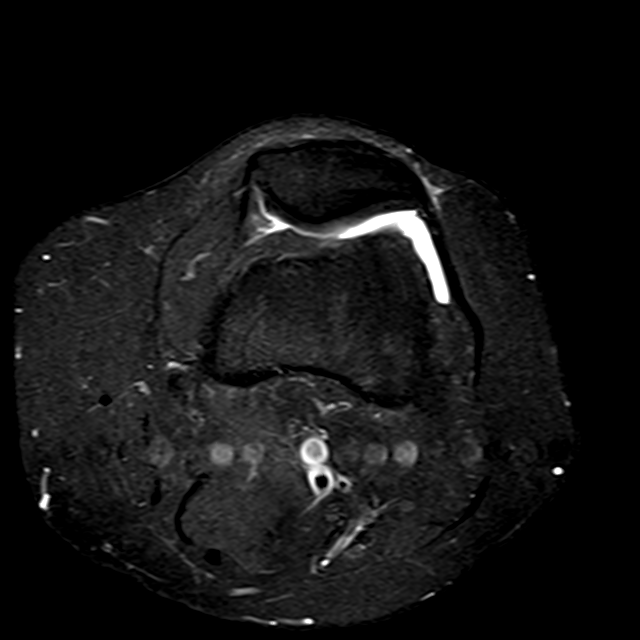
[im 22/22]
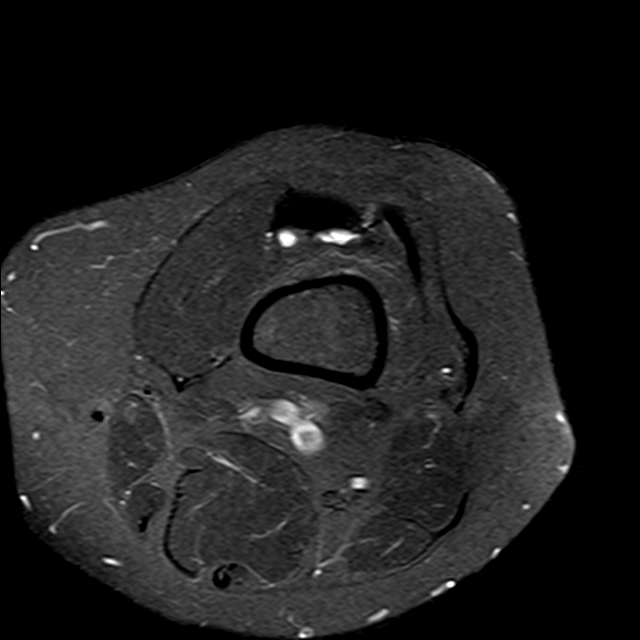

[Series 3: pd_sag_fs · sagittal · 3.0mm · 0.29mm/px · 5 of 24 slices shown]
[im 1/24]
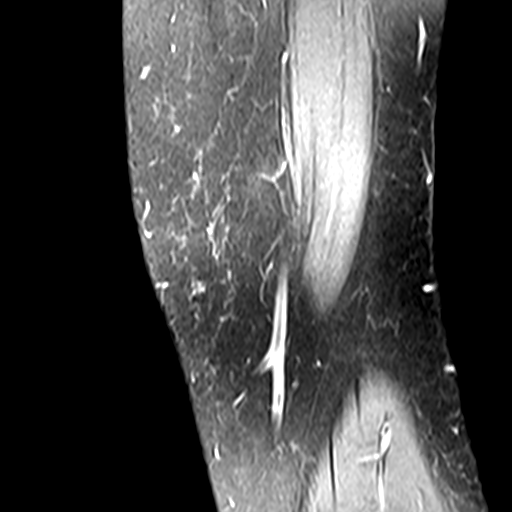
[im 4/24]
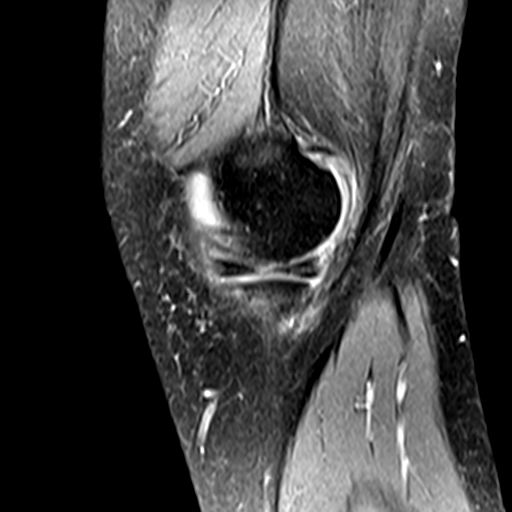
[im 8/24]
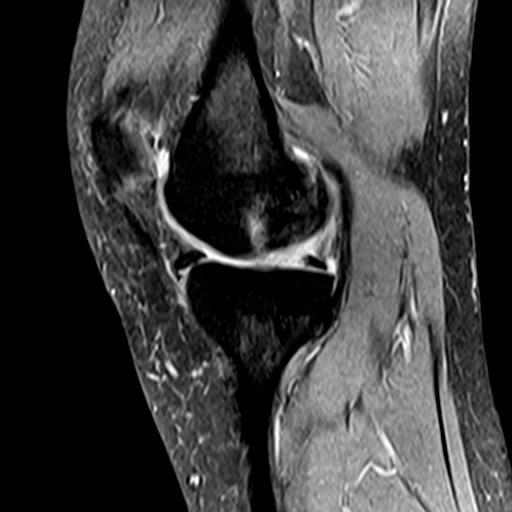
[im 12/24]
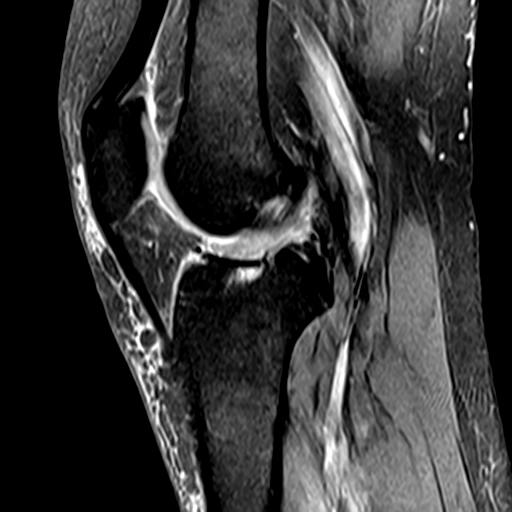
[im 20/24]
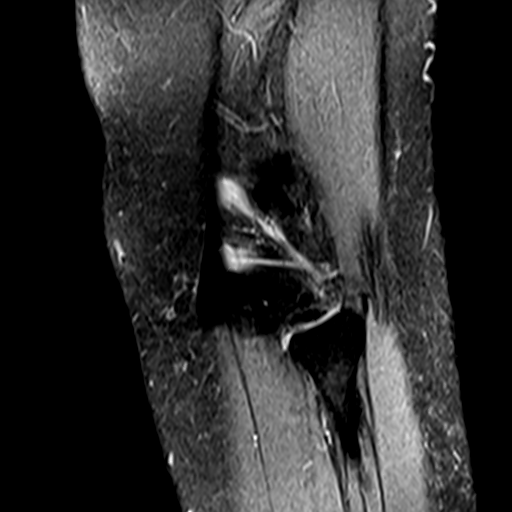

[Series 4: t2_sag_fs · sagittal · 3.0mm · 0.29mm/px · 3 of 24 slices shown]
[im 4/24]
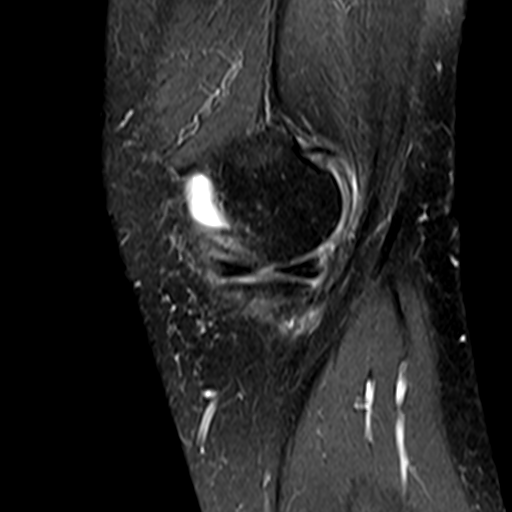
[im 12/24]
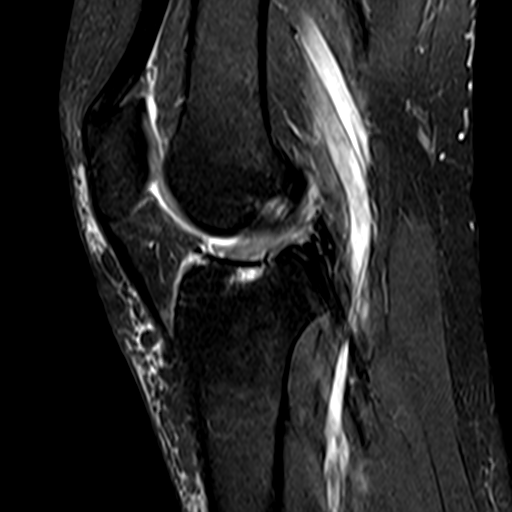
[im 20/24]
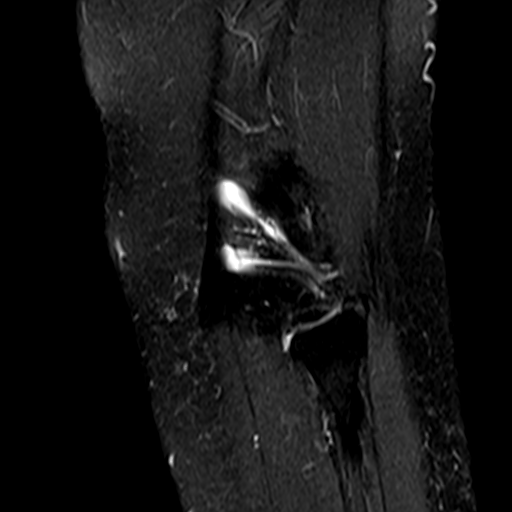

[Series 5: t1_cor · coronal · 4.0mm · 0.47mm/px · 3 of 20 slices shown]
[im 4/20]
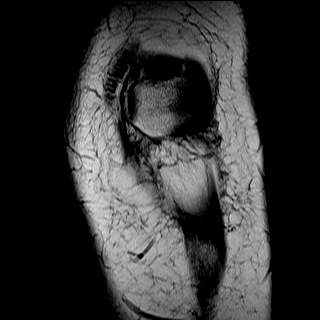
[im 12/20]
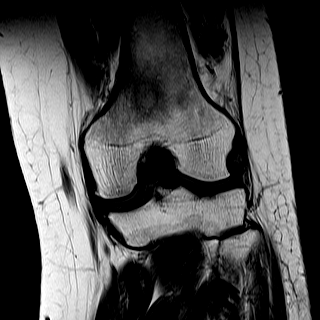
[im 20/20]
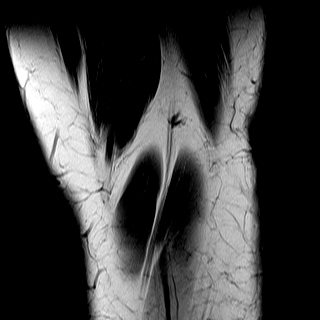

[17 of 40 positions shown; findings below may reference images not displayed]

FINDINGS: OSSEOUS: No acute fracture, avascular necrosis or aggressive osseous lesion.
MEDIAL COMPARTMENT:
Medial meniscus: Evidence of interval partial meniscectomy with blunting and truncation in the posterior horn. No recurrent tear. Protrusion of the medial meniscal body into the medial gutter.
Articular cartilage: Development of deep partial-thickness chondral thinning of the mid weightbearing medial femoral condyle, with underlying subchondral cystic change and marrow edema.
LATERAL COMPARTMENT:
Lateral meniscus: Intact.
Articular cartilage: Intact.
PATELLOFEMORAL JOINT AND EXTENSOR MECHANISM:
Quadriceps tendon: The visualized portion of the distal quadriceps tendon is intact.
Patella tendon: Intact.
Articular cartilage: Intact.
Alignment: The alignment of the patellofemoral joint is within normal limits, in the imaged position.
LIGAMENTS:
Anterior cruciate ligament: Intact.
Posterior cruciate ligament: Intact.
Medial collateral ligament: Intact.
Lateral collateral ligament: Intact.
MUSCULOTENDINOUS: The visualized musculotendinous soft tissues about the knee are unremarkable.
OTHER: Small knee joint effusion.  A 10 x 8 x 7 mm T2 hyperintense, T1 isointense structure is present within the superficial subcutaneous soft tissues in the anterior medial aspect of the knee,
underlying the skin marker.
IMPRESSION: 1.  Evidence of interval partial medial meniscectomy, without recurrent tear.
2.  Interval progression of mild medial compartment chondrosis.
3.  10 x 8 x 7 mm masslike T2 hyperintense structure superficial subcutaneous soft tissues of the anterior medial aspect of the knee, underlying the skin marker. Further evaluation with MRI of the
left knee without and with contrast utilizing a tumor/mass protocol is recommended for further characterization.

## 2022-01-03 IMAGING — CT CT CARDIAC CALCIUM SCORING
1 of 2 series · 11 of 20 positions shown, 14 images · non-contrast
Comparison: Screening mammogram 02/05/21

Images Obtained from [facility]
REASON FOR EXAM: Mixed hyperlipidemia. Left anterior fascicular block. Essential (primary) hypertension.
TECHNIQUE: Multiple helical CT images of the heart were acquired without the administration of IV contrast. Post processing software quantified calcium (Threshold 130 HU) in the coronary arteries
with Agatston scores. Coronary screening is most appropriately used in asymptomatic patients with clinical risk factors for coronary atherosclerosis. Dose reduction technique used: Automated exposure
control and adjustment of the mA and/or kV according to patient size. CT Studies and Cardiac Nuclear Medicine Studies in last 12-months = 0
Total radiation dose to patient is CTDIvol 13.20 mGy and DLP 188.00 mGy-cm.
NUMBER OF CALCIFIED CORONARY ARTERY PLAQUES
Left Main: 0
Left Anterior Descending: 0
Circumflex: 1
Right Coronary Artery: 0
Total: 1
CORONARY ARTERY CALCIUM SCORE
Circumflex:
Right Coronary Artery and PDA: 0
TOTAL AGATSTON SCORE:
OTHER FINDINGS: 2.0 x 1.8 cm well-circumscribed mass in the medial right breast.

[Series 2: calcium score · axial · 0.43mm/px · z∈[-1399,-1309]mm · 11 of 73 slices shown, 14 images]
[im 7/73  vessel]
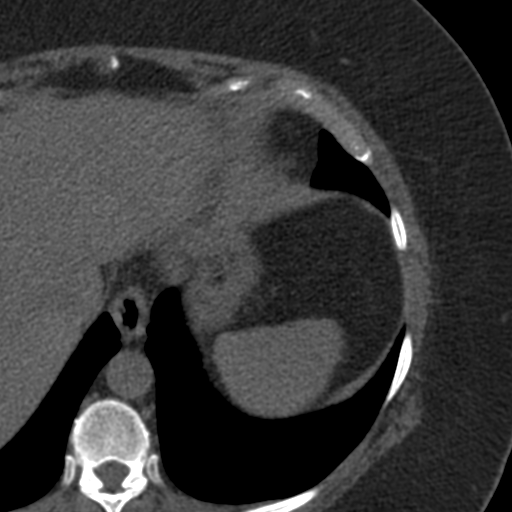
[im 7/73  lung]
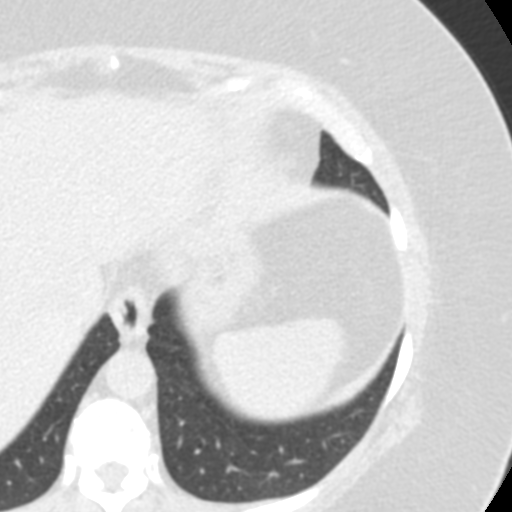
[im 13/73  vessel]
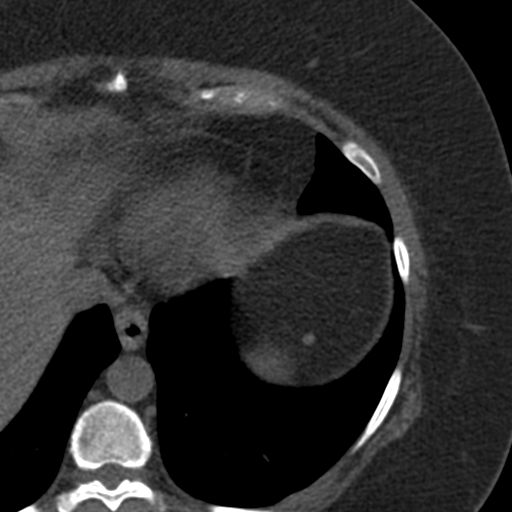
[im 19/73  vessel]
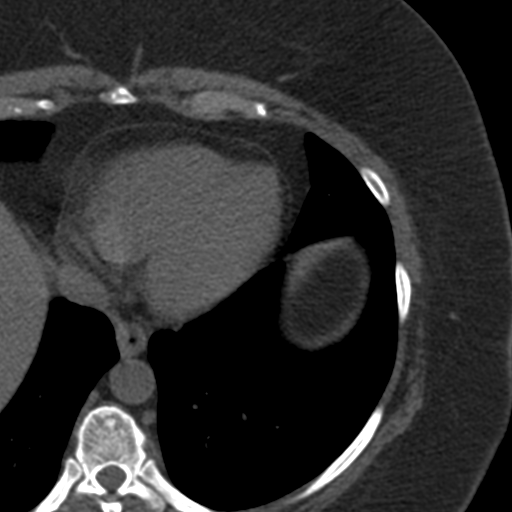
[im 25/73  vessel]
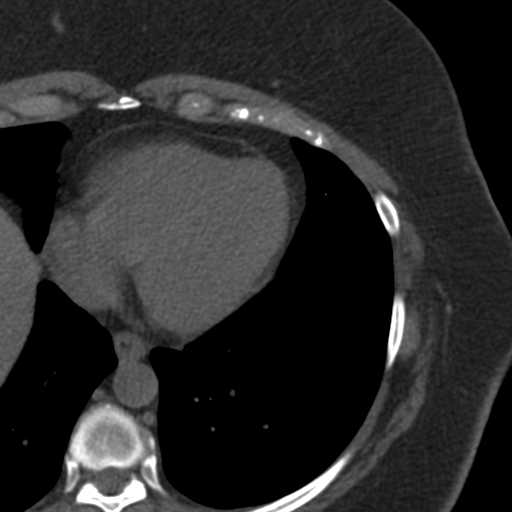
[im 31/73  vessel]
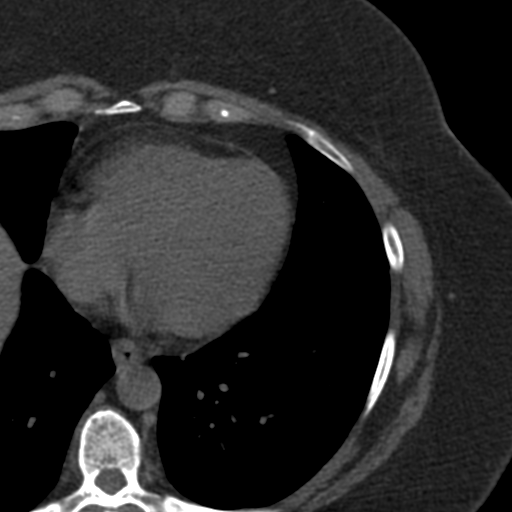
[im 31/73  lung]
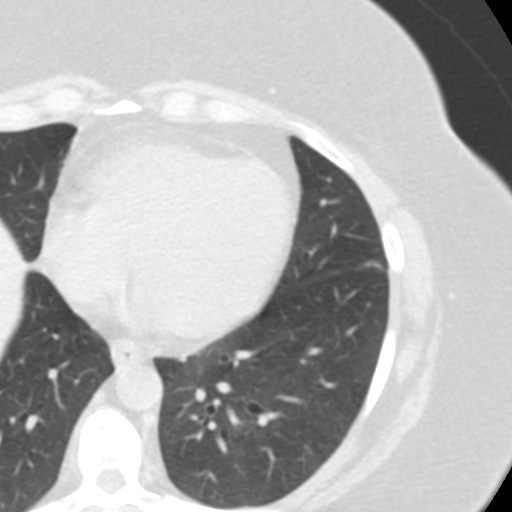
[im 37/73  vessel]
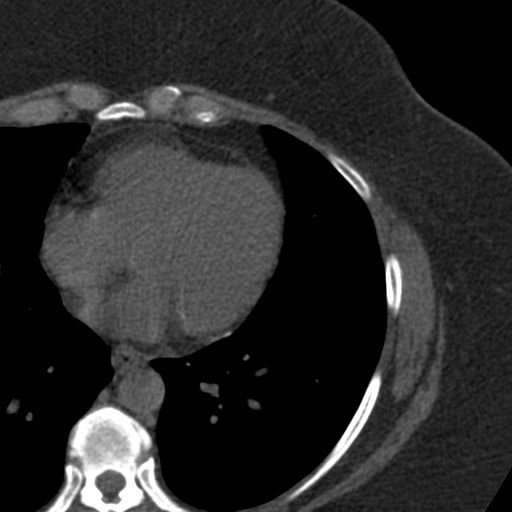
[im 43/73  vessel]
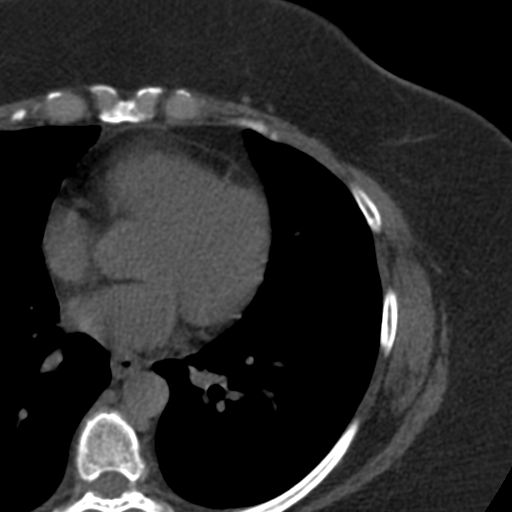
[im 49/73  vessel]
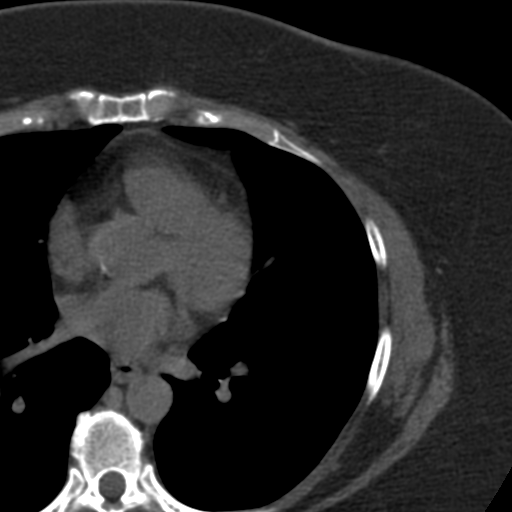
[im 55/73  vessel]
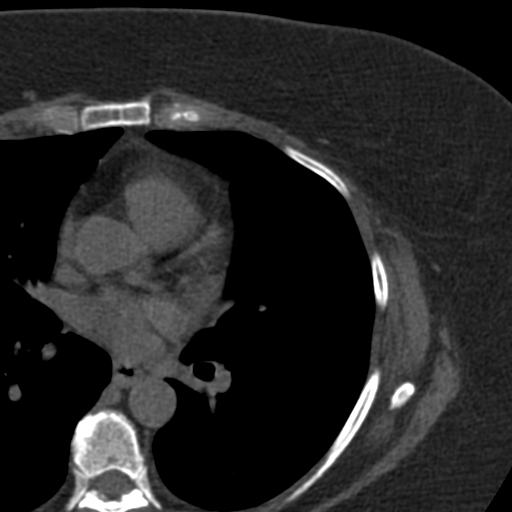
[im 55/73  lung]
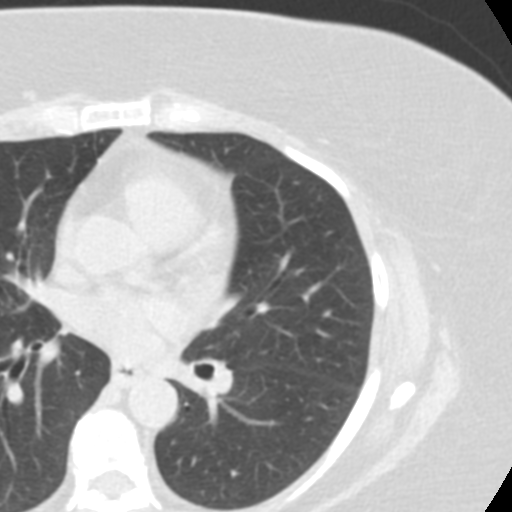
[im 61/73  vessel]
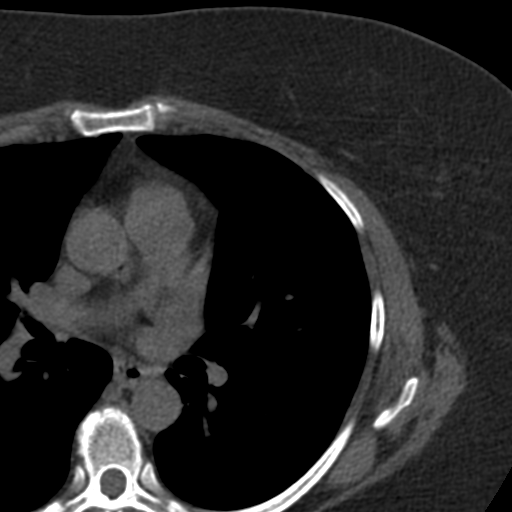
[im 67/73  vessel]
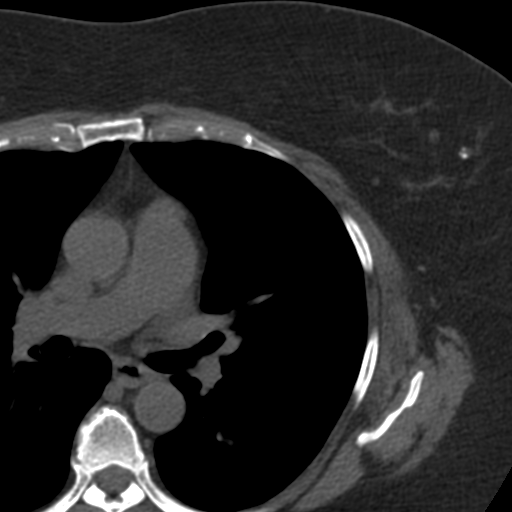

[11 of 20 positions shown; findings below may reference images not displayed]

IMPRESSION: 1. Examination is technically satisfactory.
2. Agatston score
3. 2 cm mass in the medial right breast. This appears larger than previously noted masses/cysts on screening mammogram 02/05/21. Consider repeat mammogram.
0: No evidence of CAD (very low risk of future coronary event)
1-10: Minimal CAD (low risk of future coronary event)
11-100: Mild CAD (increased risk of future coronary event)
101-400: Moderate CAD (relative lifetime risk of coronary event is 4.3 times a patient with a score of 0)*
[id number]: Severe CAD (relative lifetime risk of coronary event is 7.2 times a patient with a score of 0)*
>3000 (relative lifetime risk of coronary event is 10.8 times a patient with a score of 0)*
*Saleha, et al, Clinical expert consensus document on coronary calcium screening, J Am Coll Cardiology. 8771; [DATE]

## 2022-02-19 IMAGING — US US BREAST RT LTD
1 series · 14 of 14 positions shown · non-contrast
Comparison: The present examination has been compared to prior imaging studies.

Images Obtained from [facility]
HISTORY: Patient is 61 years old and is seen for diagnostic evaluation of a mass in the medial right breast seen on prior CT cardiac calcium scoring. The patient has a history of left ultrasound guided core
biopsy in 7549 - benign and right biopsy at an unknown age - benign. The patient has no personal history of cancer. The patient does not have a first degree relative with breast cancer.
TECHNIQUE: Bilateral 2-D digital diagnostic mammogram was performed followed by 3-D tomosynthesis. Current study was also evaluated with a computer aided detection (CAD) system. Targeted right breast
ultrasound was also performed.

[Series 1: us breast right ltd · 14 of 14 slices shown]
[im 1/14]
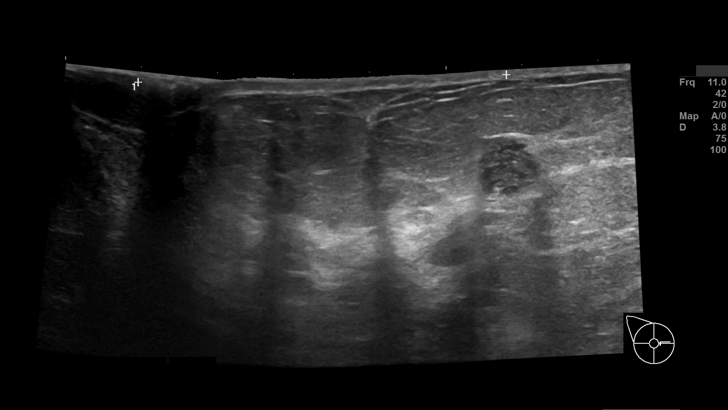
[im 2/14]
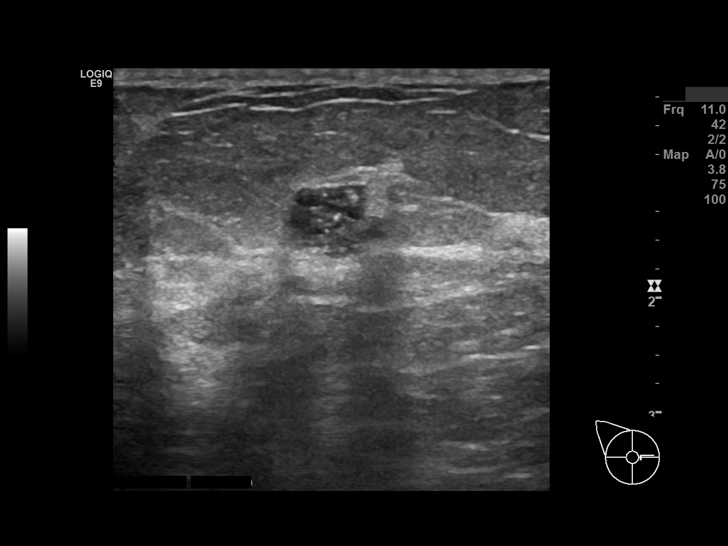
[im 3/14]
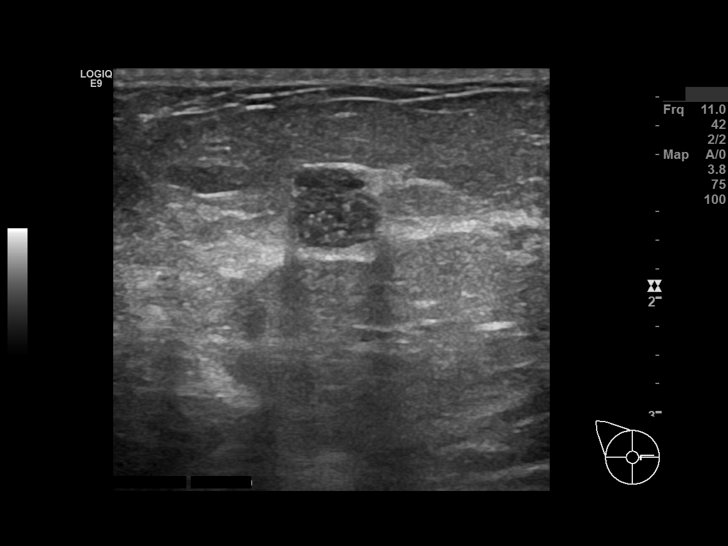
[im 4/14]
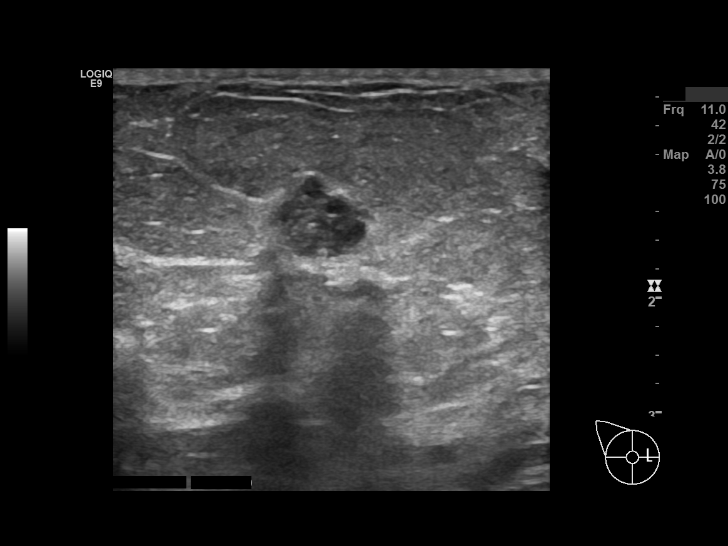
[im 5/14]
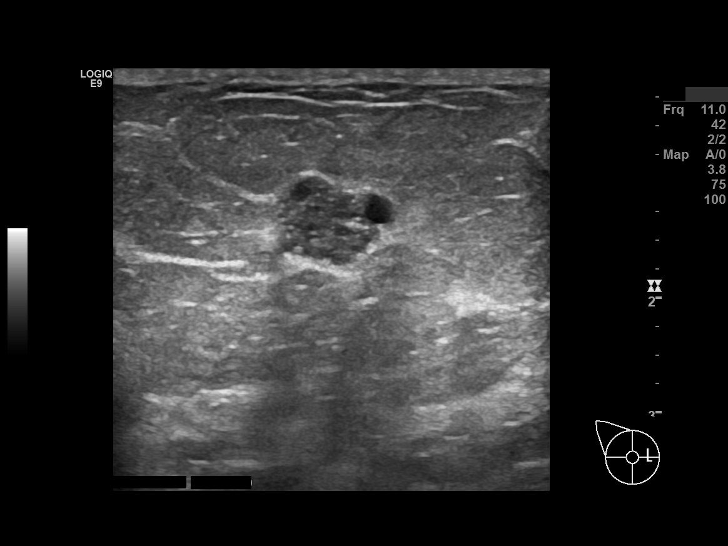
[im 6/14]
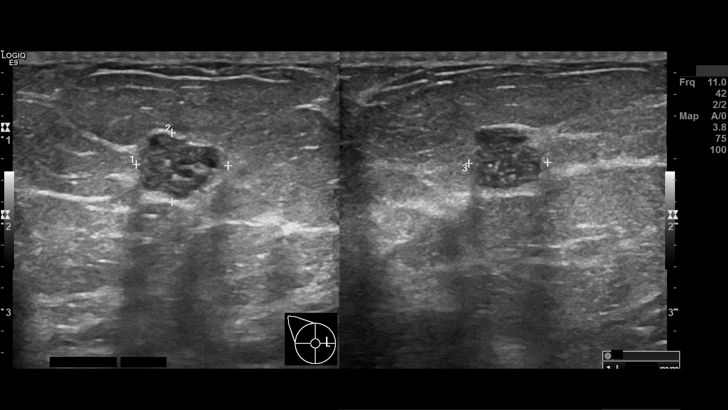
[im 7/14]
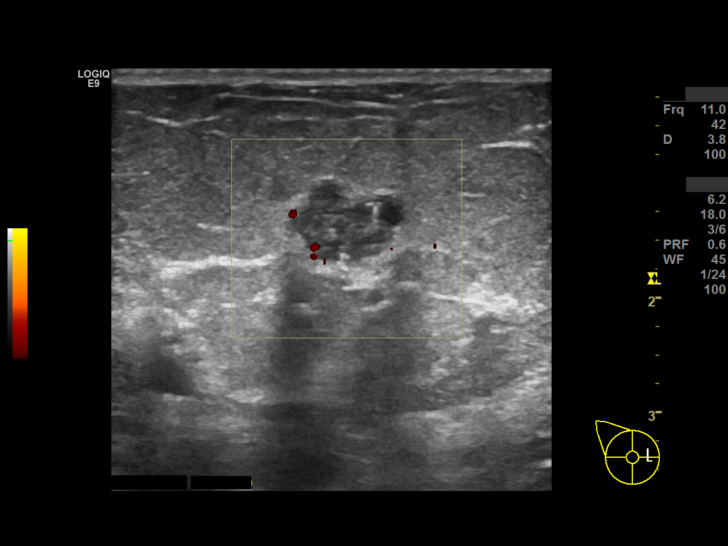
[im 8/14]
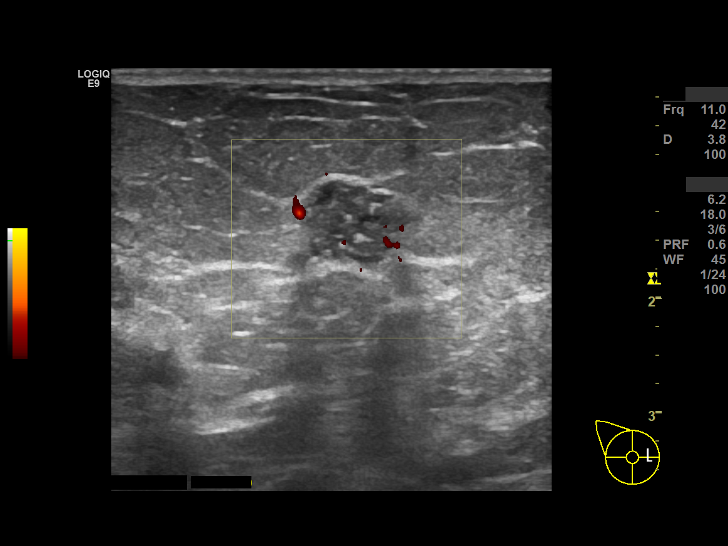
[im 9/14]
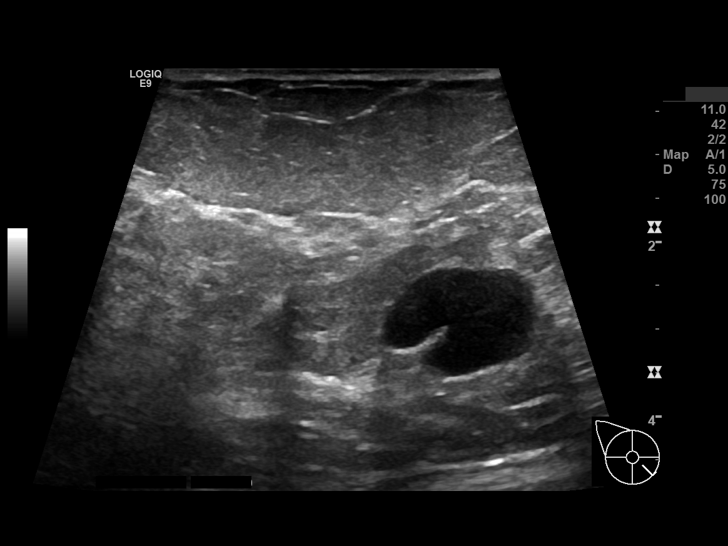
[im 10/14]
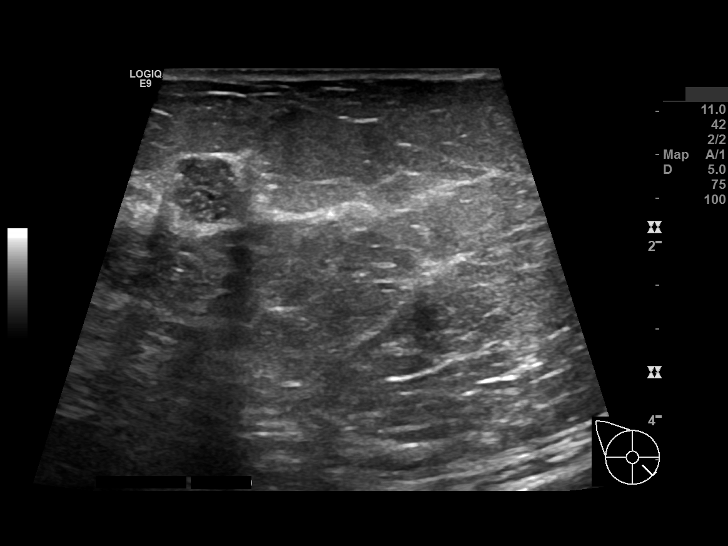
[im 11/14]
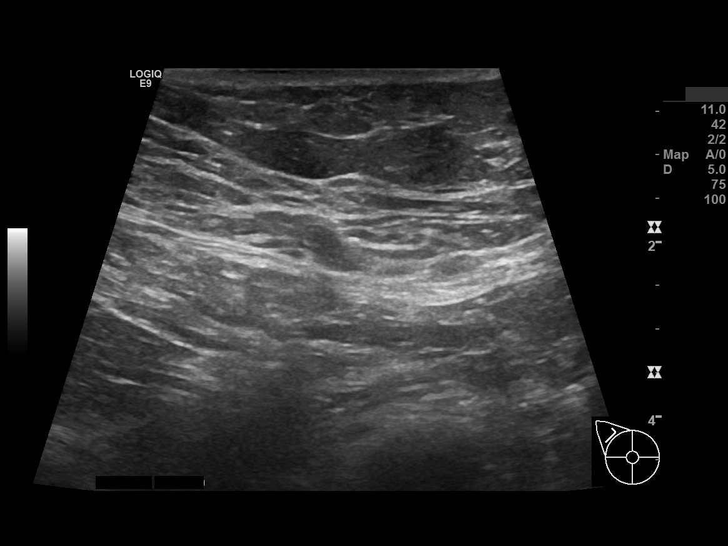
[im 12/14]
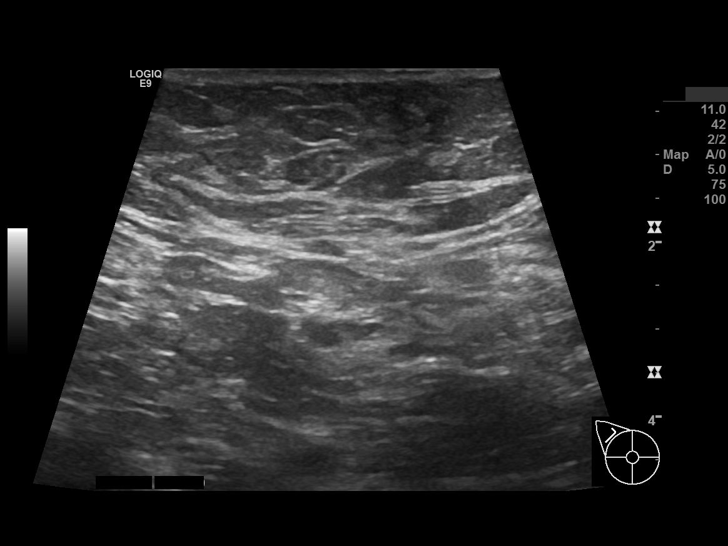
[im 13/14]
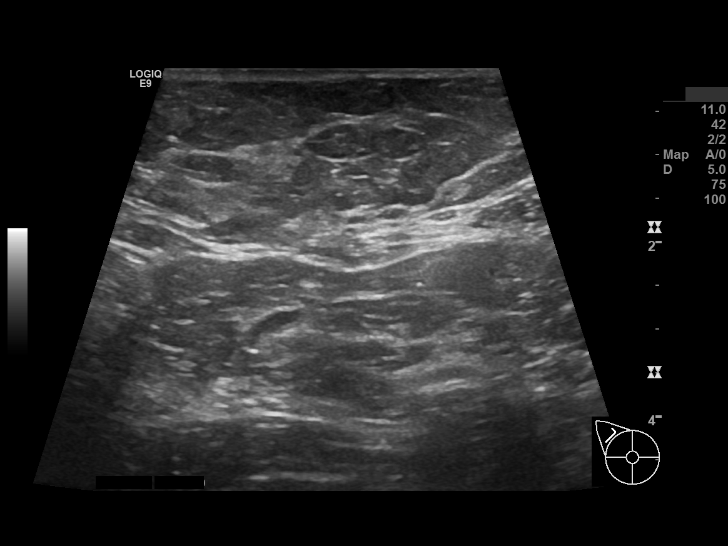
[im 14/14]
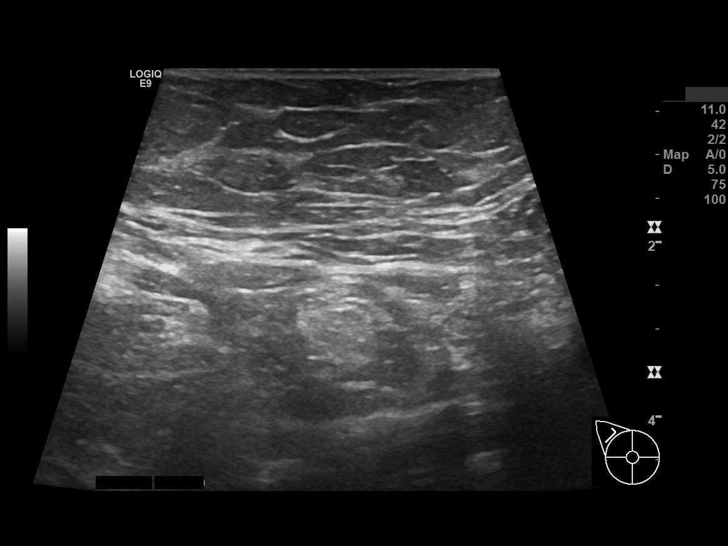

[14 of 14 positions shown; findings below may reference images not displayed]

FINDINGS: BILATERAL DIAGNOSTIC MAMMOGRAM:  The breasts are heterogeneously dense, which may obscure small masses. There are multiple oval, circumscribed masses throughout both breasts. In the right breast at 3
o'clock, there is an oval mass that was described on the prior cardiac CT as having enlarged. This mass has demonstrated minimal growth over the last 3 years mammographically.
TARGETED RIGHT BREAST ULTRASOUND: In the right breast at 3 o'clock 5 cm from the nipple, there is a 1.1 x 0.8 x 0.9 cm oval, hypoechoic mass with slightly indistinct margins. This correlates with
mammographic and CT findings. No suspicious lymph nodes in the right axilla. On real-time scanning, multiple simple cysts were identified throughout the breast.
IMPRESSION: The mass in the right breast at 3 o'clock 5 cm from the nipple is indeterminate. An ultrasound-guided core needle biopsy is recommended.
FINAL ASSESSMENT: BI-RADS: Category 4 Suspicious
Results and recommendations were discussed with the patient.

## 2022-02-19 IMAGING — MG MAMMO DIAG BIL W/CAD TOMO
8 series · 8 of 24 positions shown · non-contrast
Comparison: The present examination has been compared to prior imaging studies.

Images Obtained from [facility]
HISTORY: Patient is 61 years old and is seen for diagnostic evaluation of a mass in the medial right breast seen on prior CT cardiac calcium scoring. The patient has a history of left ultrasound guided core
biopsy in 7549 - benign and right biopsy at an unknown age - benign. The patient has no personal history of cancer. The patient does not have a first degree relative with breast cancer.
TECHNIQUE: Bilateral 2-D digital diagnostic mammogram was performed followed by 3-D tomosynthesis. Current study was also evaluated with a computer aided detection (CAD) system. Targeted right breast
ultrasound was also performed.

[R CC]
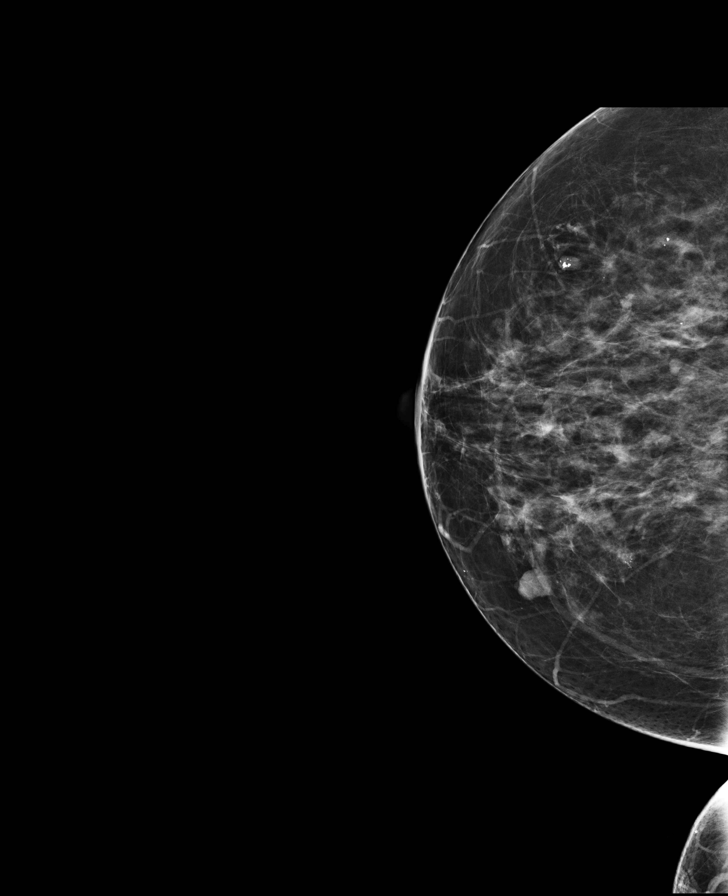

[L MLO]
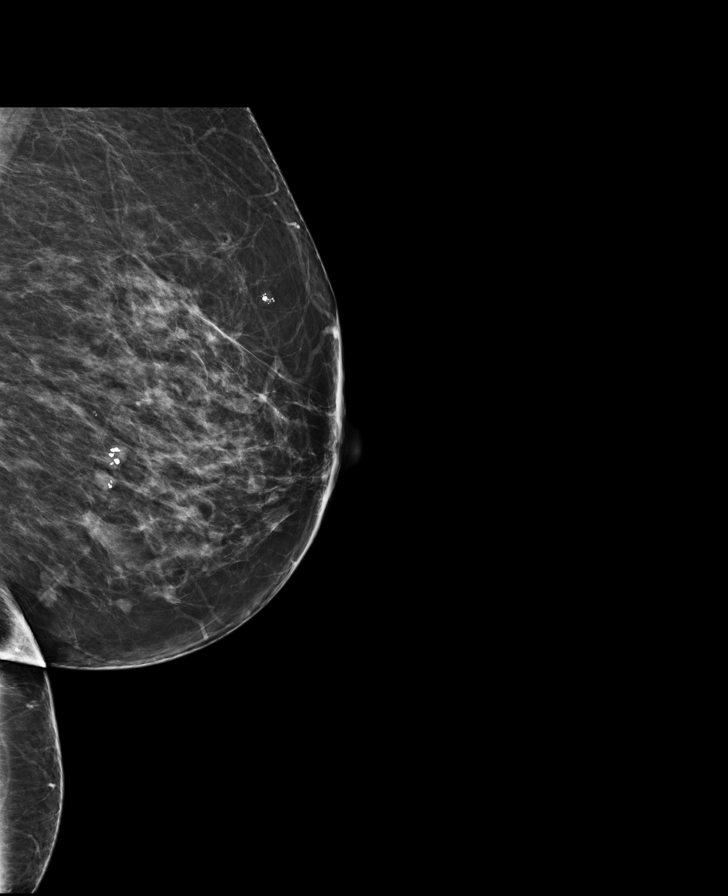

[R MLO]
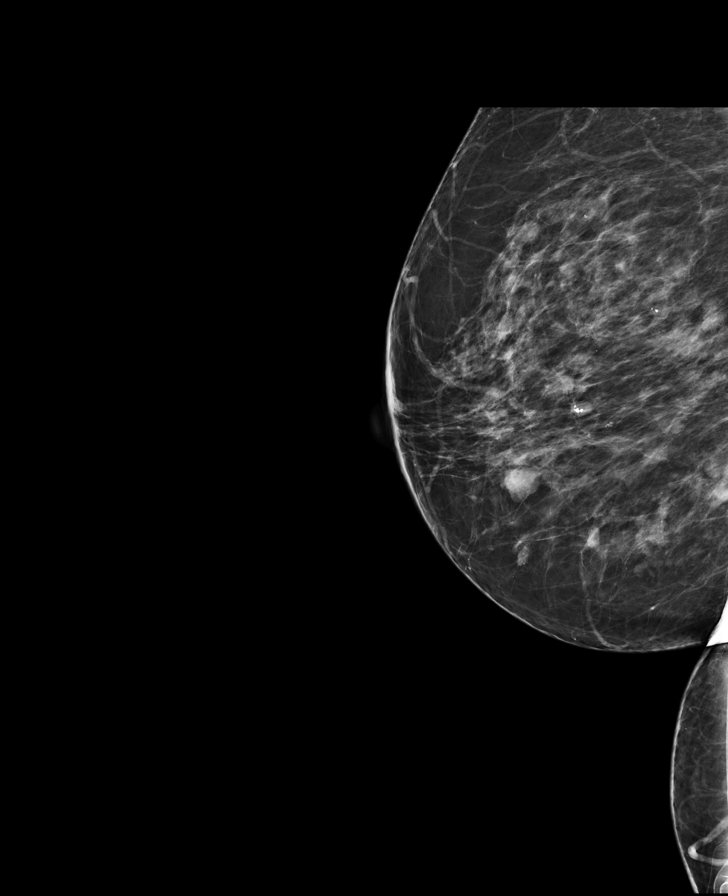

[L CC]
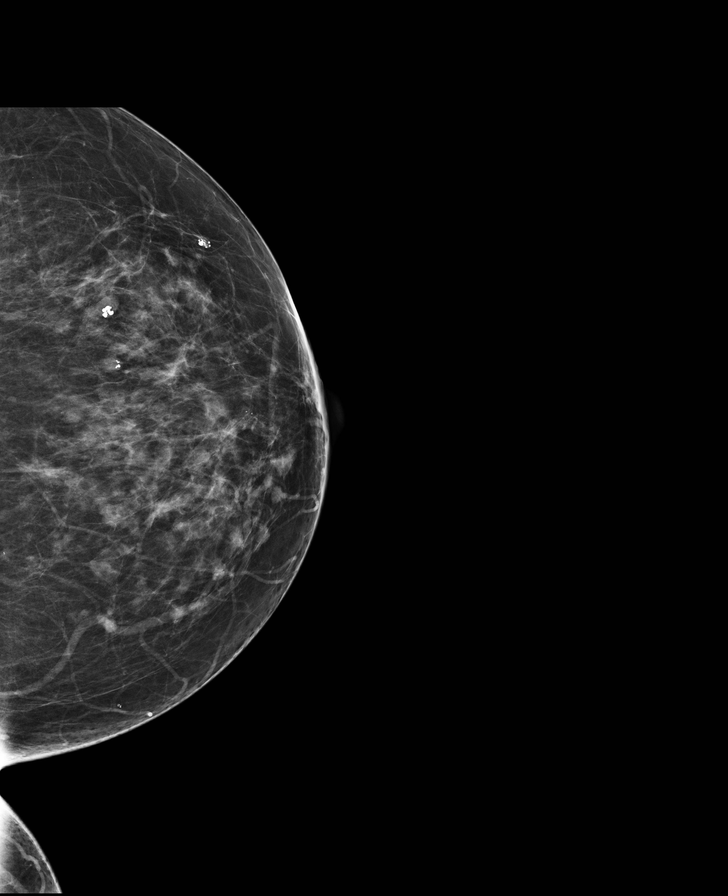

[L MLO tomo · tomo slice 43/84.0]
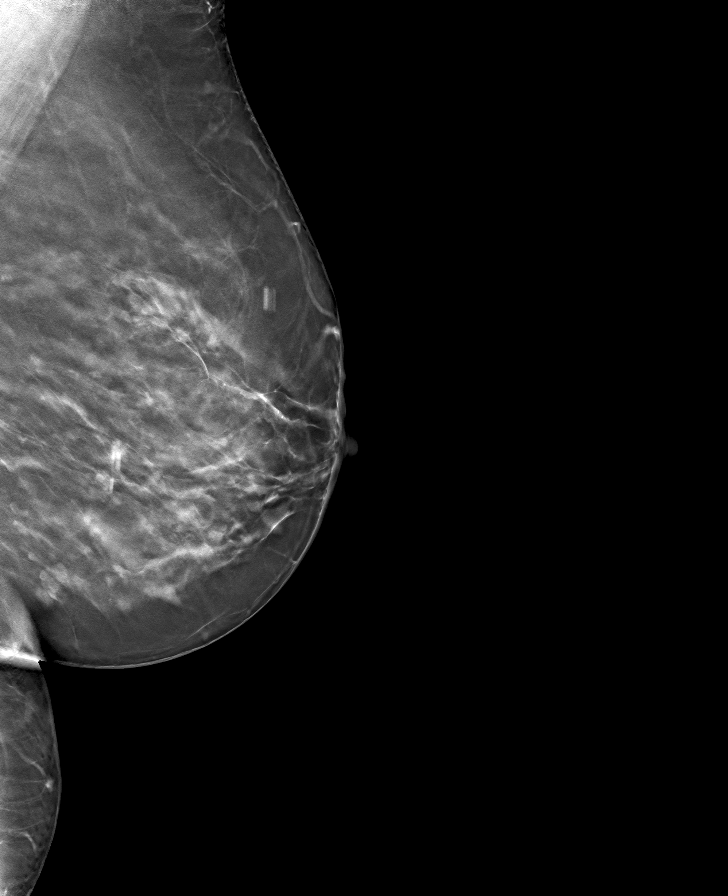

[R MLO tomo · tomo slice 40/79.0]
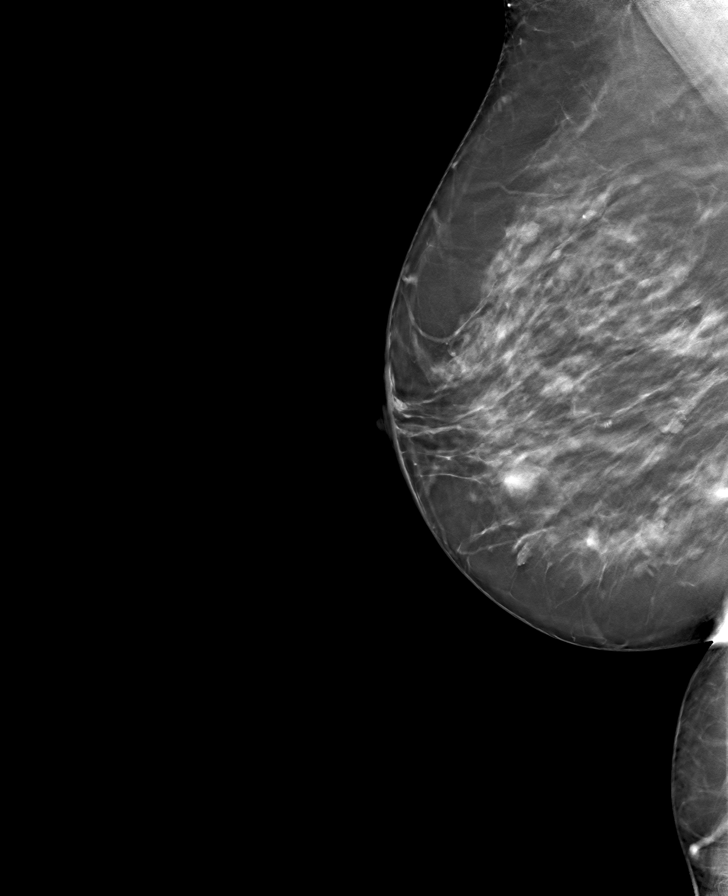

[L CC tomo · tomo slice 39/78.0]
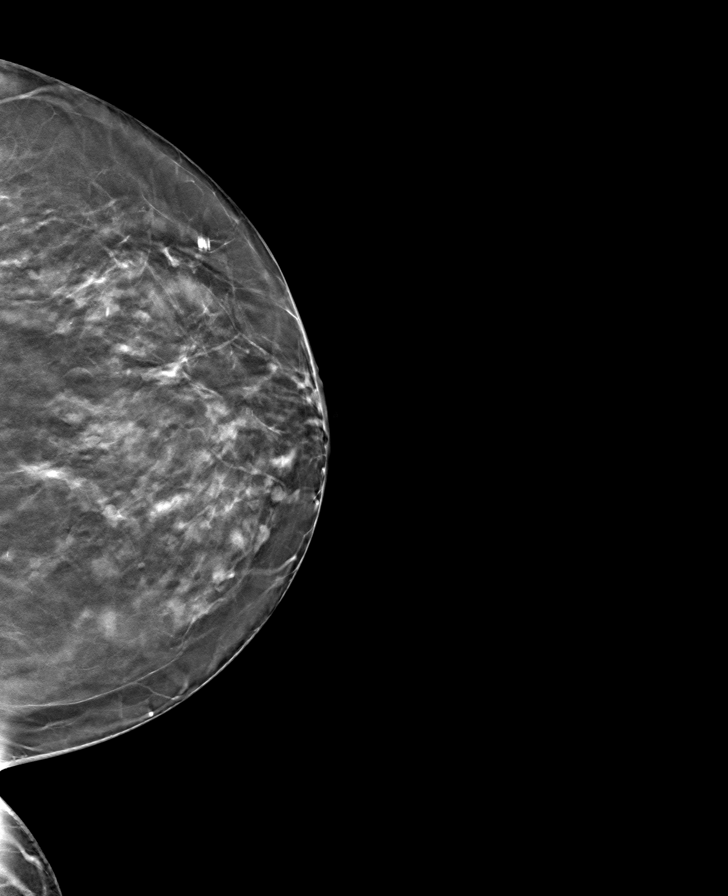

[R CC tomo · tomo slice 37/74.0]
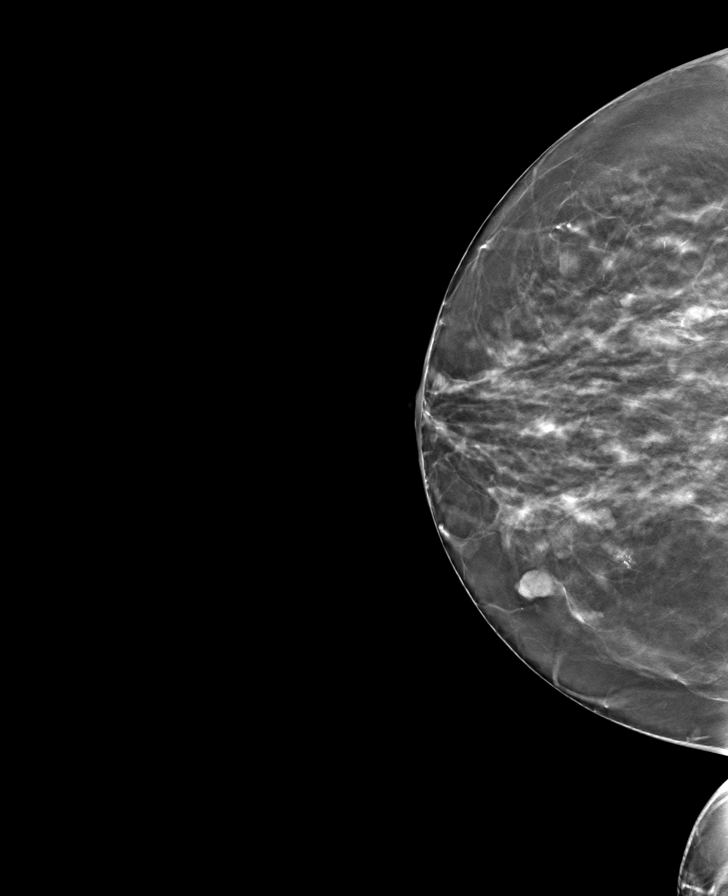

[8 of 24 positions shown; findings below may reference images not displayed]

FINDINGS: BILATERAL DIAGNOSTIC MAMMOGRAM:  The breasts are heterogeneously dense, which may obscure small masses. There are multiple oval, circumscribed masses throughout both breasts. In the right breast at 3
o'clock, there is an oval mass that was described on the prior cardiac CT as having enlarged. This mass has demonstrated minimal growth over the last 3 years mammographically.
TARGETED RIGHT BREAST ULTRASOUND: In the right breast at 3 o'clock 5 cm from the nipple, there is a 1.1 x 0.8 x 0.9 cm oval, hypoechoic mass with slightly indistinct margins. This correlates with
mammographic and CT findings. No suspicious lymph nodes in the right axilla. On real-time scanning, multiple simple cysts were identified throughout the breast.
IMPRESSION: The mass in the right breast at 3 o'clock 5 cm from the nipple is indeterminate. An ultrasound-guided core needle biopsy is recommended.
FINAL ASSESSMENT: BI-RADS: Category 4 Suspicious
Results and recommendations were discussed with the patient.

## 2022-03-13 IMAGING — US BX BREAST US GUIDED RT
1 series · 13 of 13 positions shown · non-contrast
Comparison: 02/19/2022
PATIENT CONSENT: Prior to the procedure risks, complications,alternatives and a description of the procedure were discussed.

Images Obtained from [facility]
INDICATION: Right breast mass.

[Series 1: bx breast us guided right · 13 of 13 slices shown]
[im 1/13]
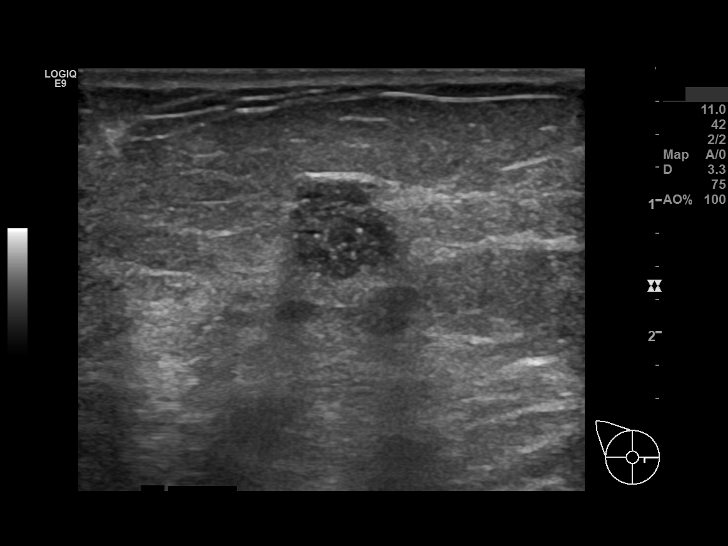
[im 2/13]
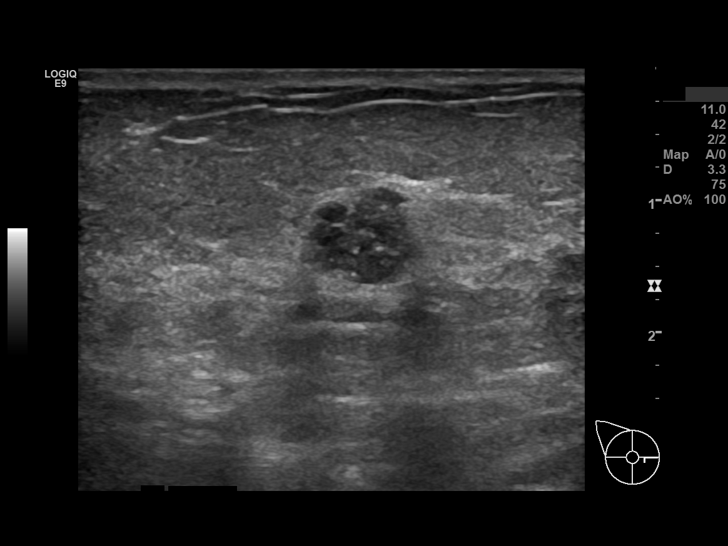
[im 3/13]
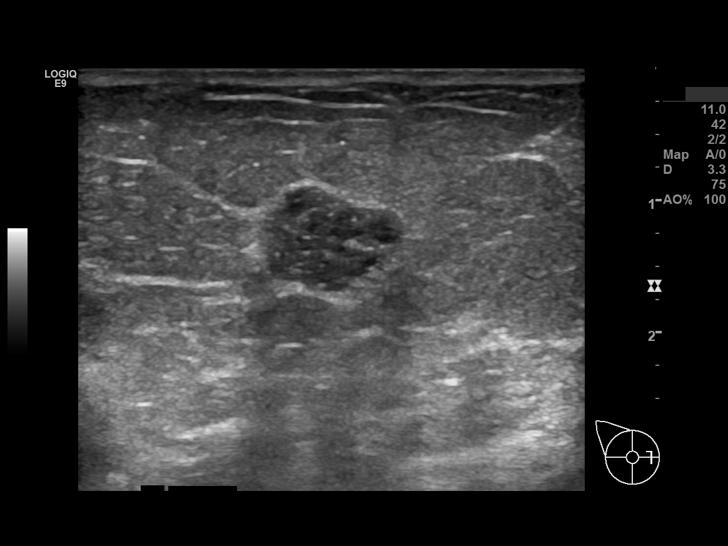
[im 4/13]
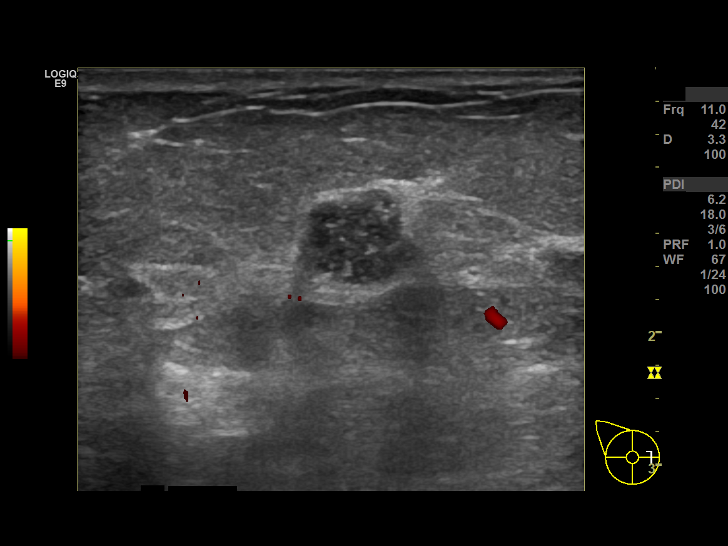
[im 5/13]
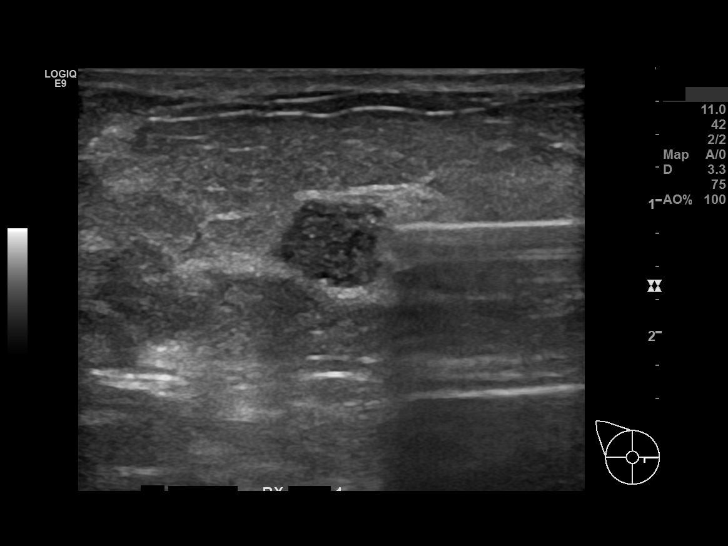
[im 6/13]
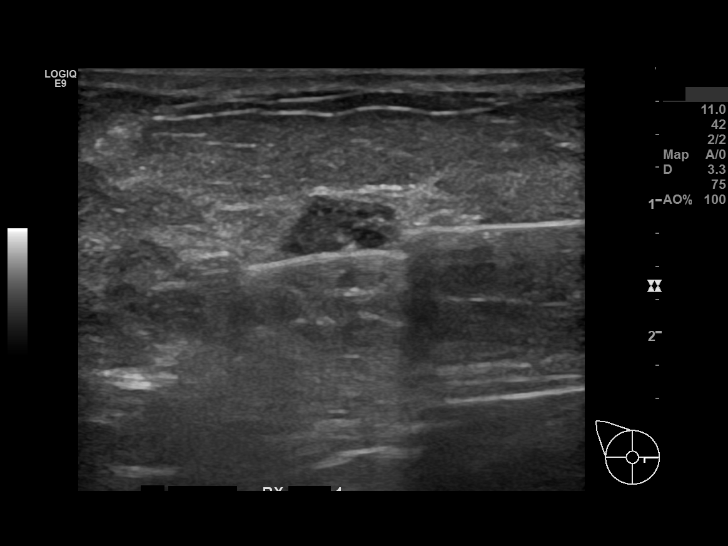
[im 7/13]
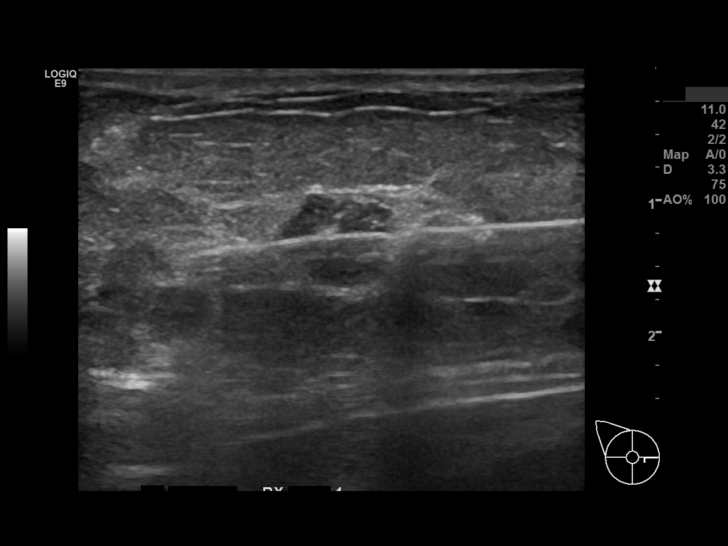
[im 8/13]
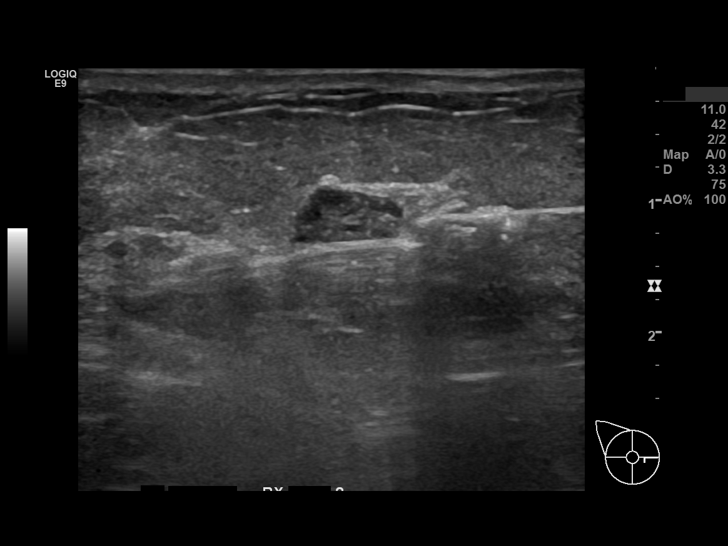
[im 9/13]
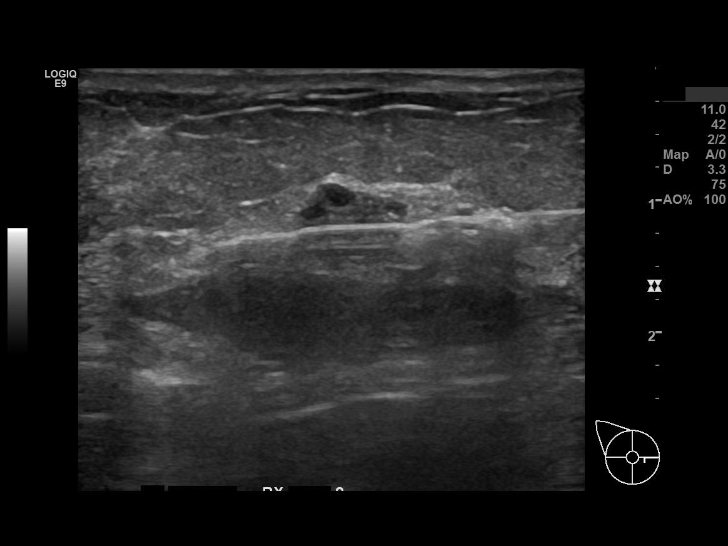
[im 10/13]
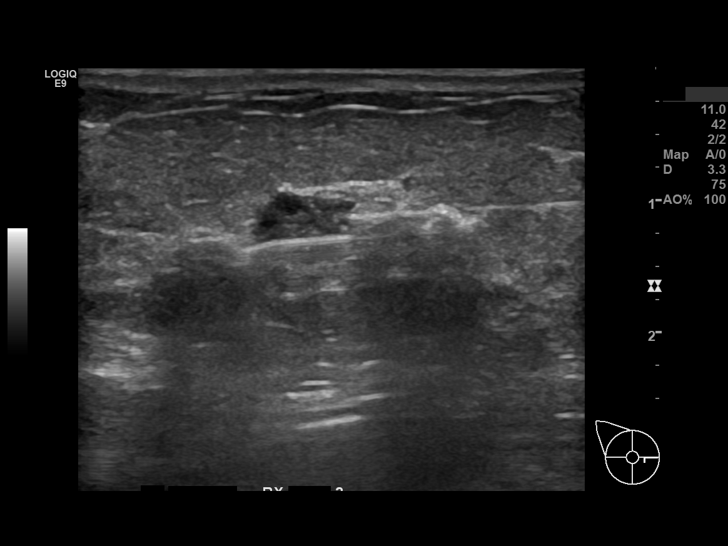
[im 11/13]
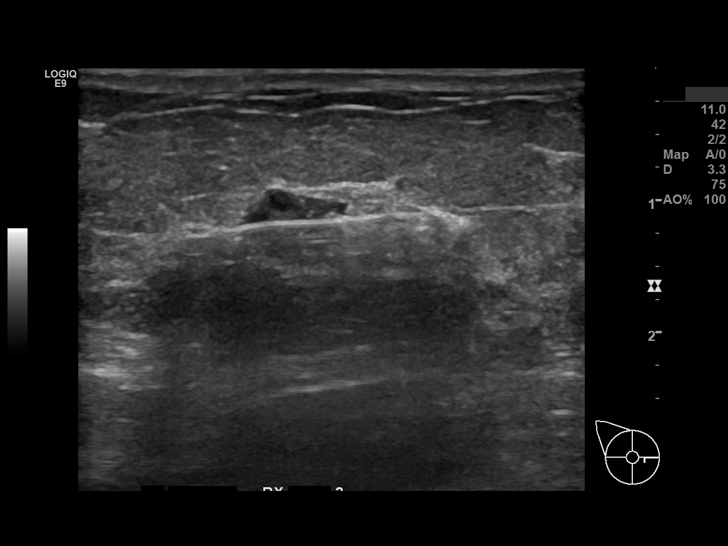
[im 12/13]
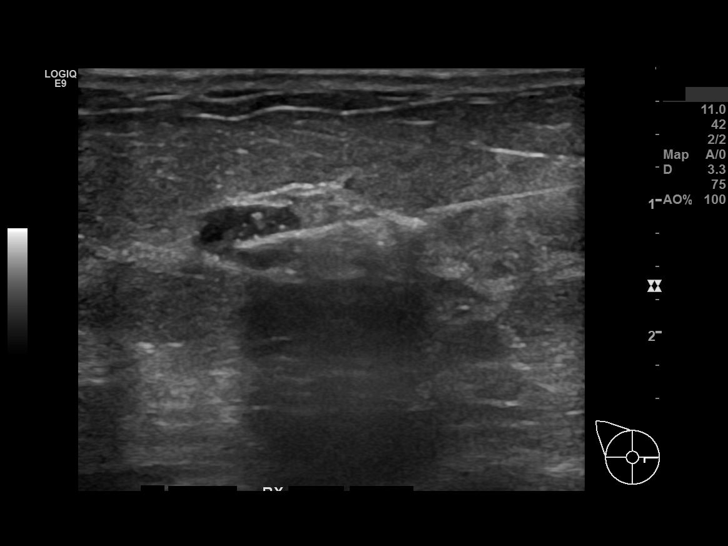
[im 13/13]
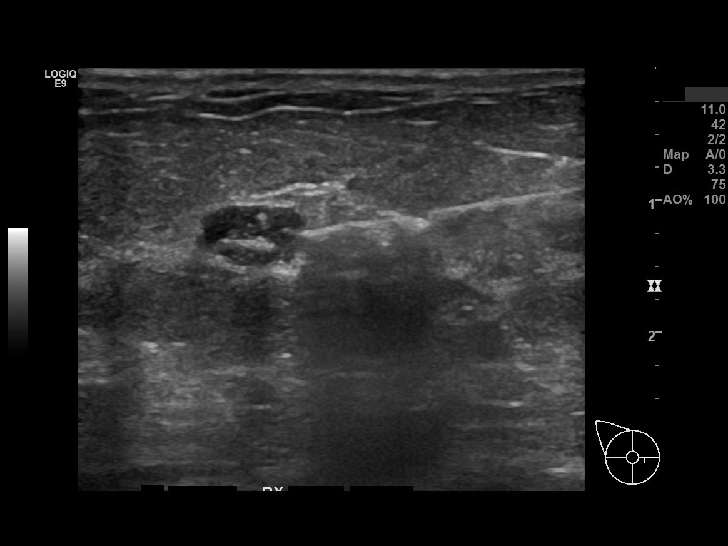

[13 of 13 positions shown; findings below may reference images not displayed]

All questions were answered.  Informed consent was obtained and signed.
PROCEDURE DESCRIPTION:
An ultrasound guided biopsy using real-time ultrasound was performed for the 1.1 cm mass in the right breast 3 o'clock 5 cm from the nipple.  This was described on the previous ultrasound report.
The skin was prepped in the usual manner.  Local anesthetic was administered to the access site.  A skin nick was made in the breast.  A 14 gauge biopsy needle was placed adjacent to the abnormality
under ultrasound guidance.  Once the needle was documented to be in the correct location, three specimens were obtained using the Sertera biopsy needle.  A Hydromark clip was inserted into the biopsy
cavity.  A skin adhesive was applied to the access site.  Post procedure digital mammographic imaging demonstrates the clip at the targeted area.  The specimens were sent to the laboratory for
pathological analysis.
A female technologist was present throughout the procedure.
IMPRESSION: BENIGN
Ultrasound guided biopsy of the 1.1 cm mass in the right breast 3 o'clock 5 cm from the nipple was successful with no apparent post procedure complications.  Pathology indicates benign stromal
fibrosis and clustered micropapillomas without atypia. This is concordant with imaging findings.
Follow-up with annual screening mammogram.
The biopsy results were discussed with the patient.

## 2022-03-13 IMAGING — MG MAMMO DIAG RT
2 series · 2 of 2 positions shown · non-contrast
Comparison: 02/19/2022
PATIENT CONSENT: Prior to the procedure risks, complications,alternatives and a description of the procedure were discussed.

Images Obtained from [facility]
INDICATION: Right breast mass.

[R ML]
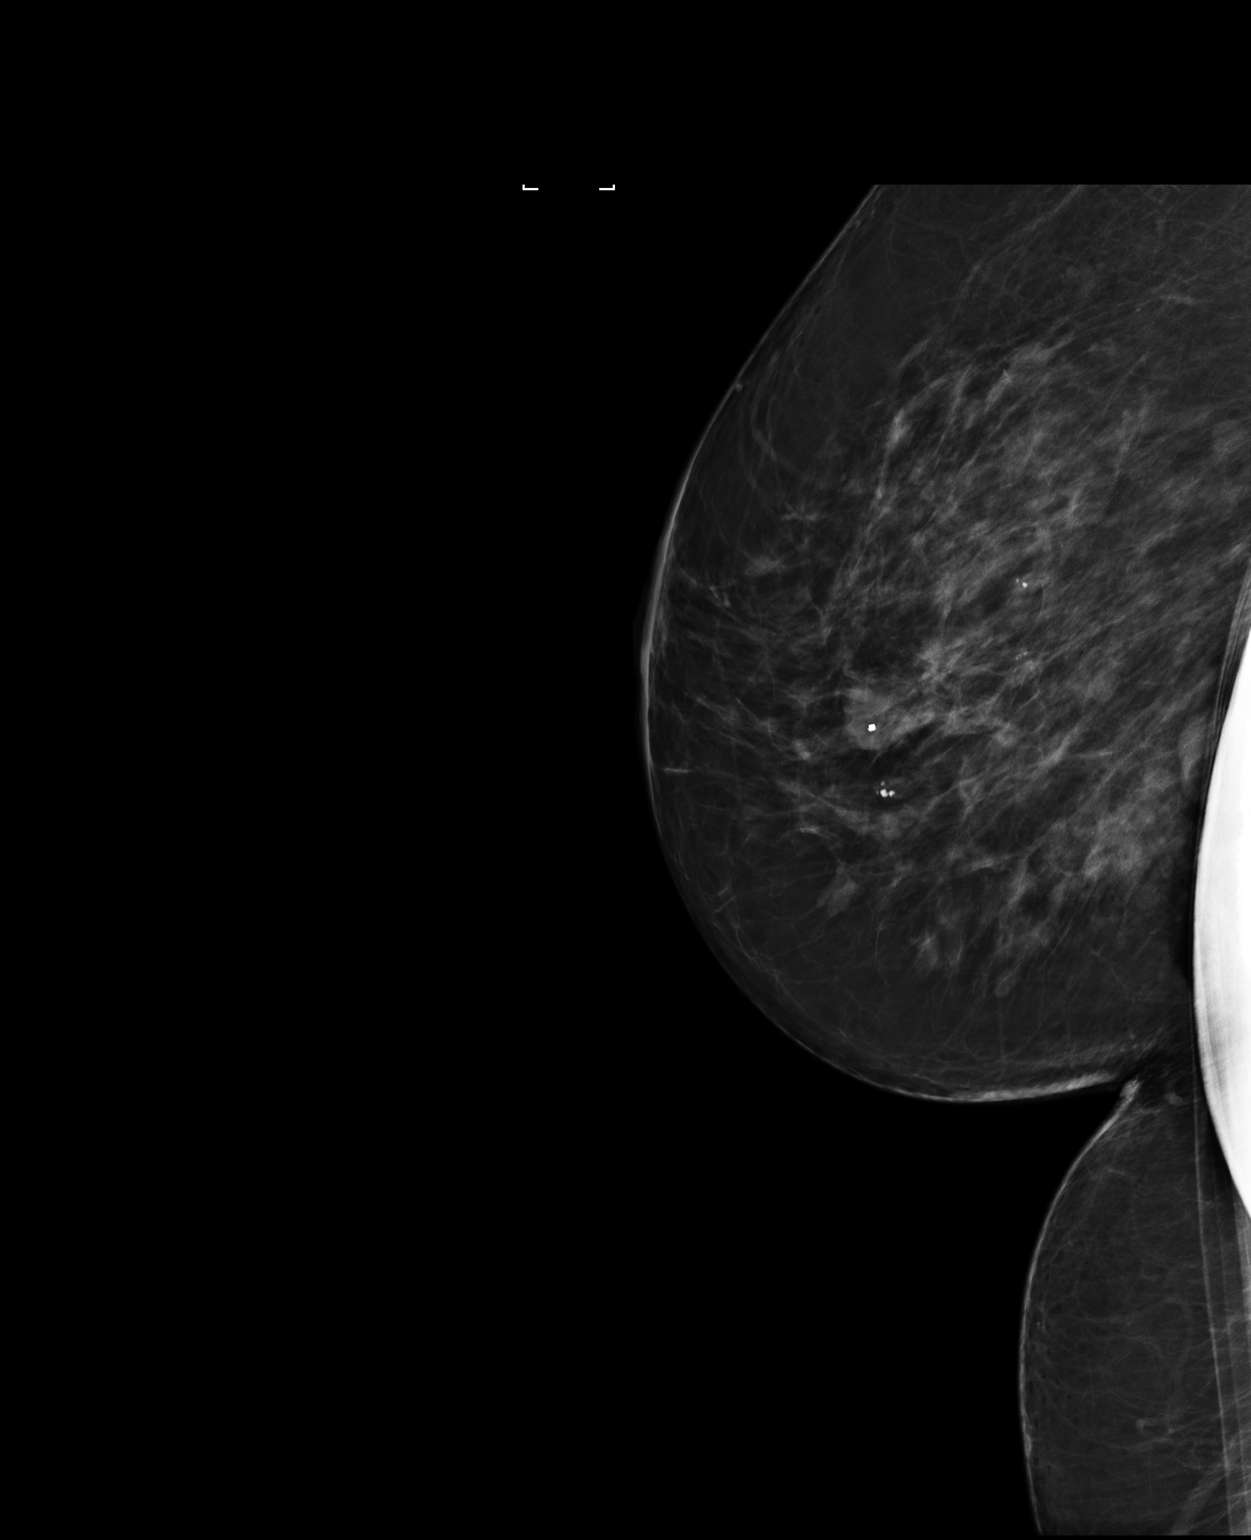

[R CC]
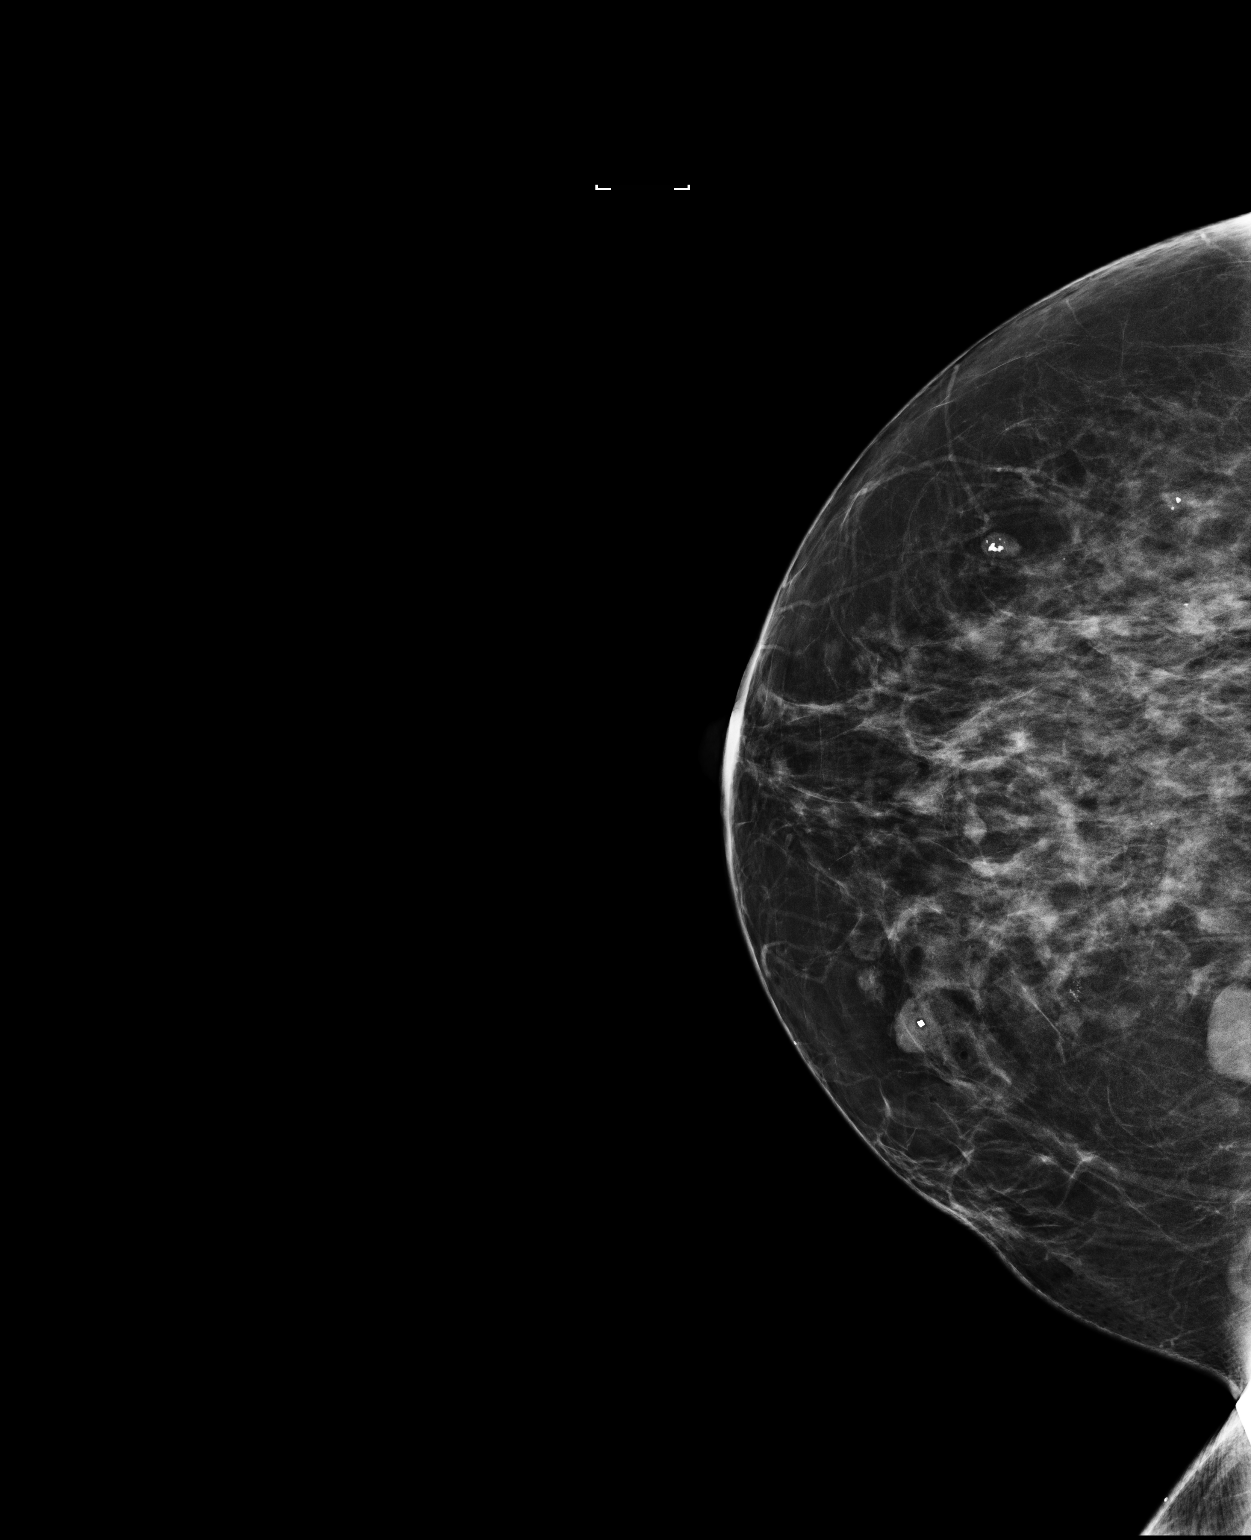

[2 of 2 positions shown; findings below may reference images not displayed]

All questions were answered.  Informed consent was obtained and signed.
PROCEDURE DESCRIPTION:
An ultrasound guided biopsy using real-time ultrasound was performed for the 1.1 cm mass in the right breast 3 o'clock 5 cm from the nipple.  This was described on the previous ultrasound report.
The skin was prepped in the usual manner.  Local anesthetic was administered to the access site.  A skin nick was made in the breast.  A 14 gauge biopsy needle was placed adjacent to the abnormality
under ultrasound guidance.  Once the needle was documented to be in the correct location, three specimens were obtained using the Sertera biopsy needle.  A Hydromark clip was inserted into the biopsy
cavity.  A skin adhesive was applied to the access site.  Post procedure digital mammographic imaging demonstrates the clip at the targeted area.  The specimens were sent to the laboratory for
pathological analysis.
A female technologist was present throughout the procedure.
IMPRESSION: BENIGN
Ultrasound guided biopsy of the 1.1 cm mass in the right breast 3 o'clock 5 cm from the nipple was successful with no apparent post procedure complications.  Pathology indicates benign stromal
fibrosis and clustered micropapillomas without atypia. This is concordant with imaging findings.
Follow-up with annual screening mammogram.
The biopsy results were discussed with the patient.

## 2022-07-31 IMAGING — CR FEMUR LT 2 VWS
4 series · 4 of 4 positions shown · non-contrast
Comparison: None

Images Obtained from [facility]
Left femur radiographs, 2 views
INDICATION: Pain in left leg

[t femur proximal ap left]
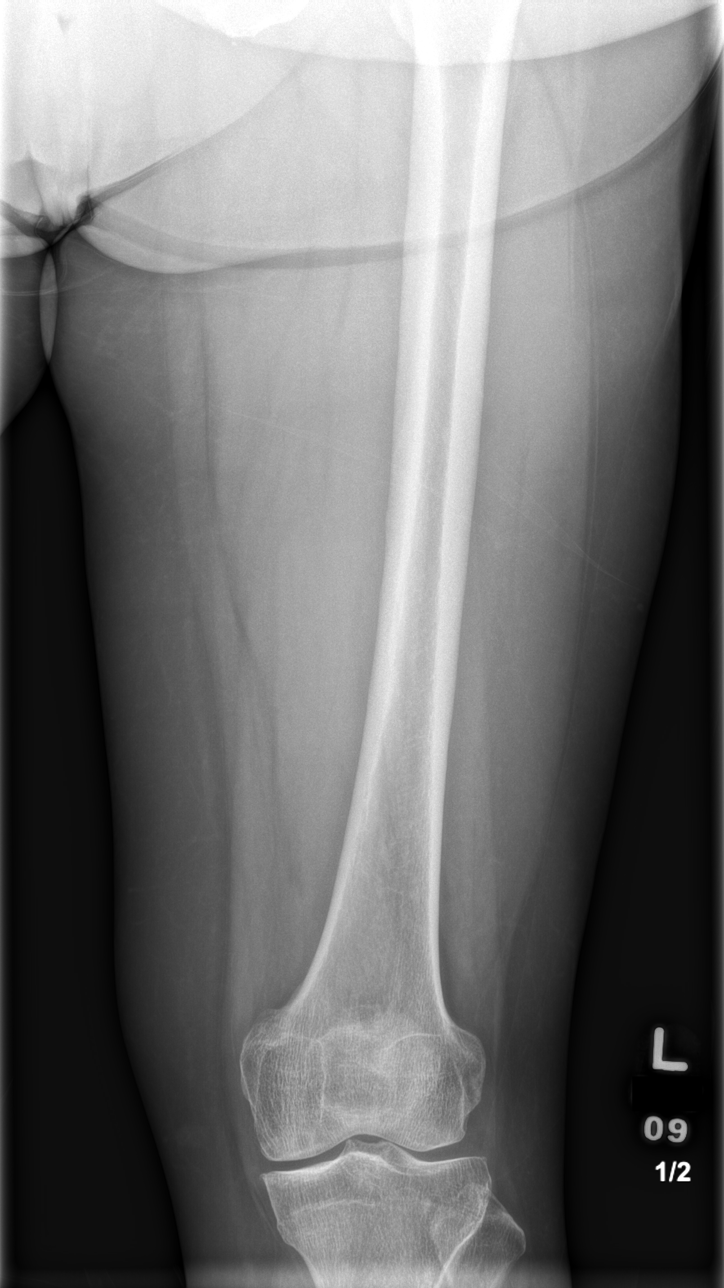

[t femur distal ap left]
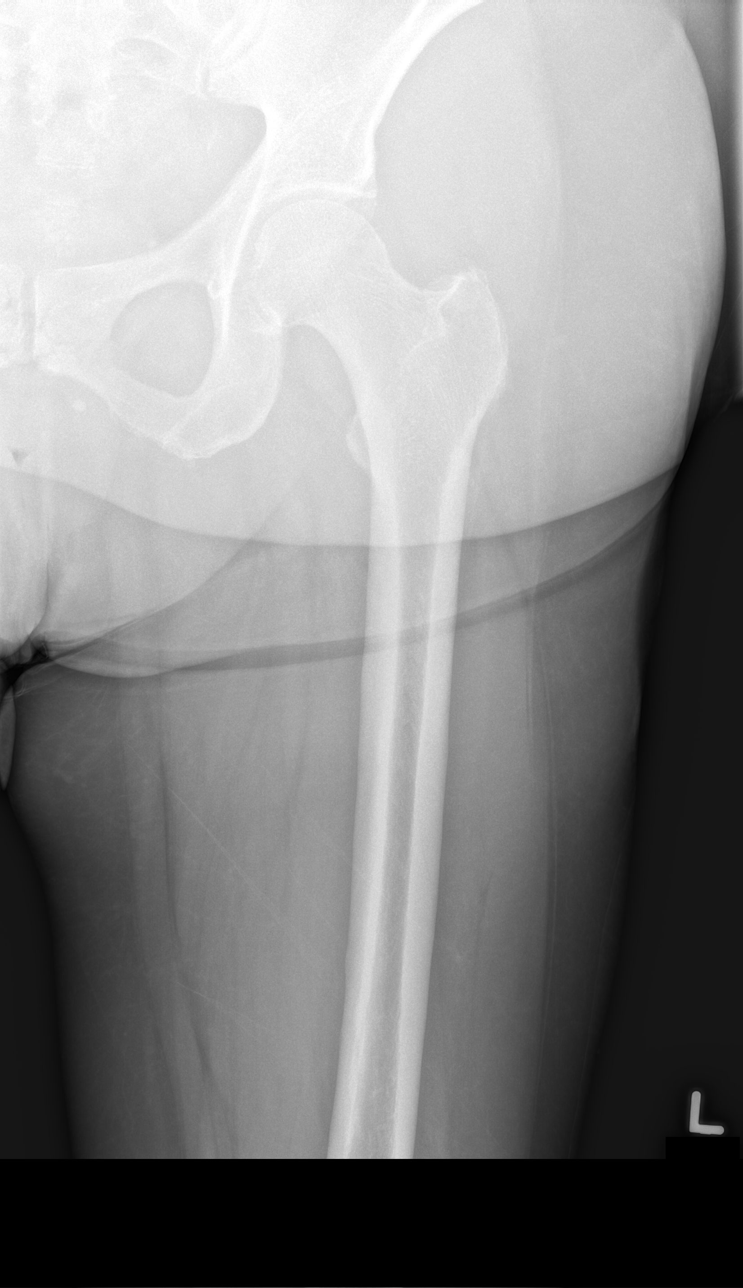

[t femur distal lat left]
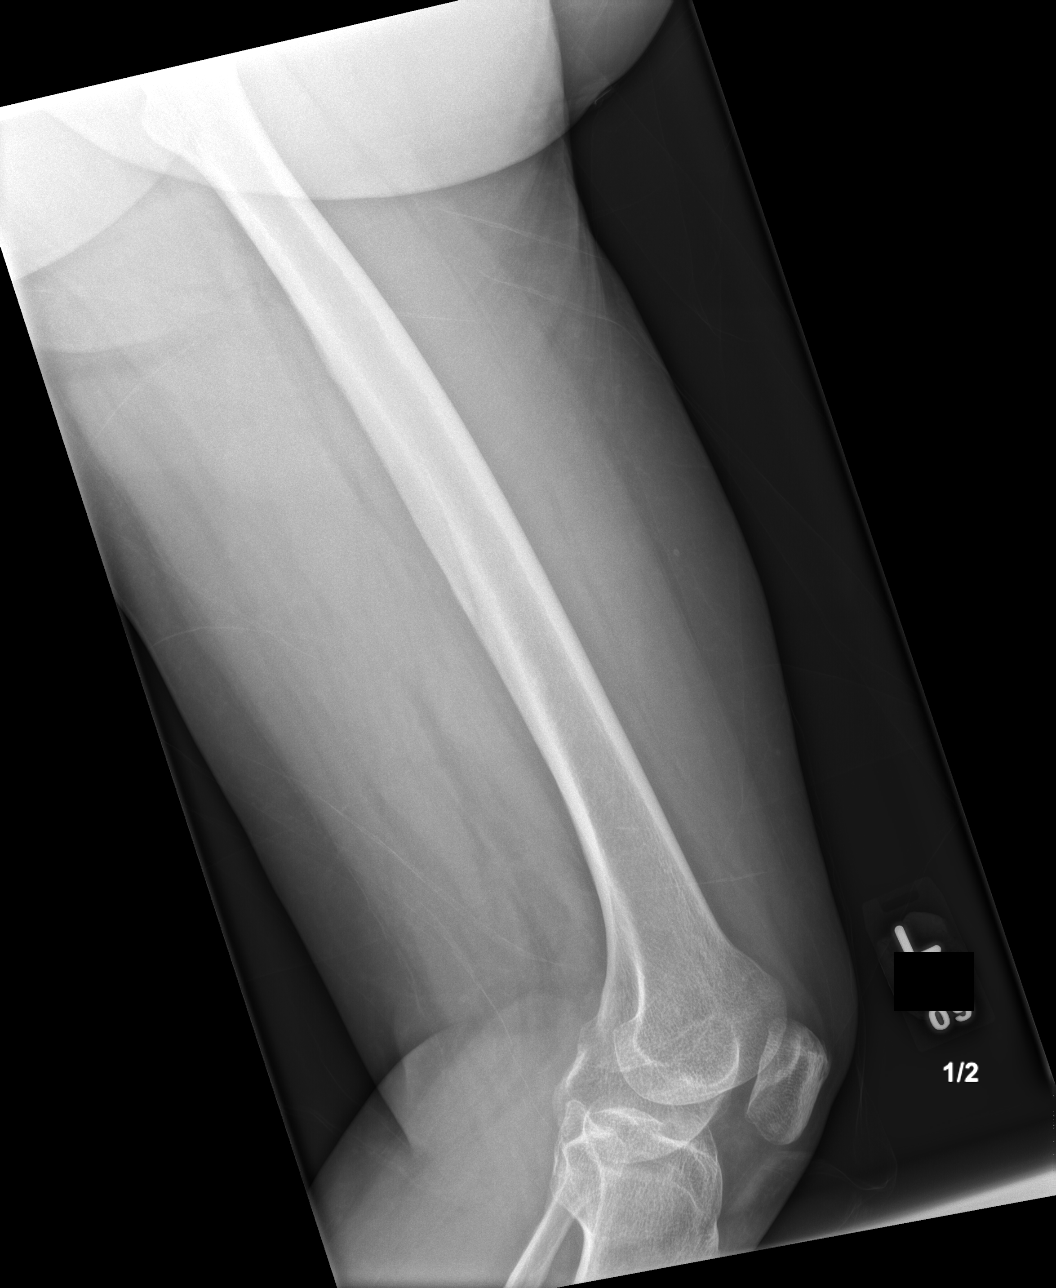

[t femur proximal lat left]
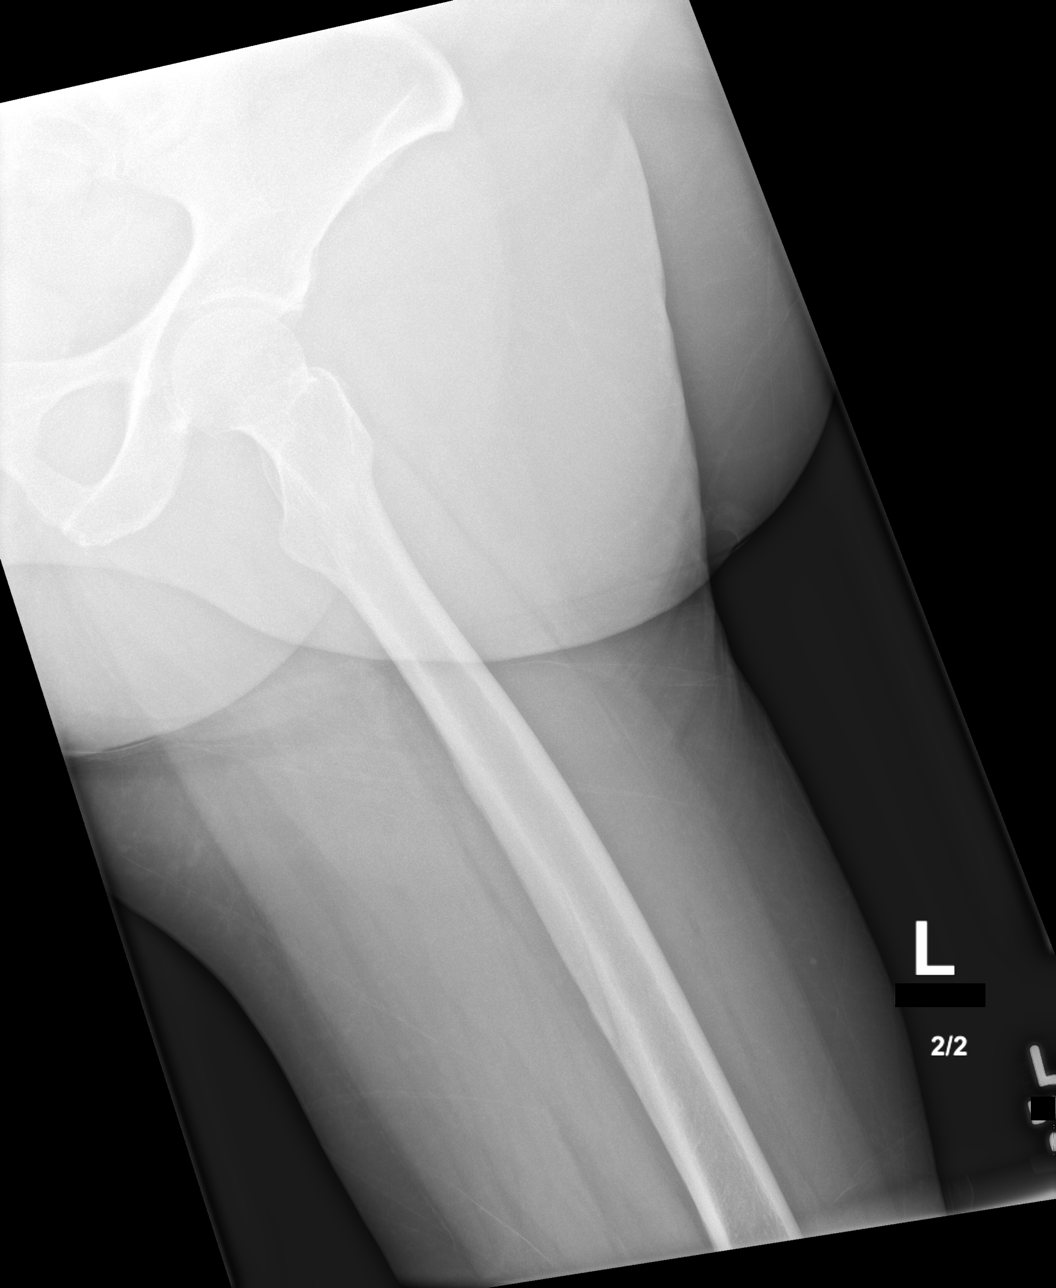

[4 of 4 positions shown; findings below may reference images not displayed]

FINDINGS: No acute fracture. No dislocation. Soft tissues are unremarkable. Mild left knee medial compartment DJD.
IMPRESSION: Mild left knee medial compartment DJD.

## 2022-07-31 IMAGING — CR HIP LT 2 VW
2 series · 2 of 2 positions shown · non-contrast
Comparison: None

Images Obtained from [facility]
Left hip radiographs, 2 views
INDICATION: Pain in left hip

[t hip ap left]
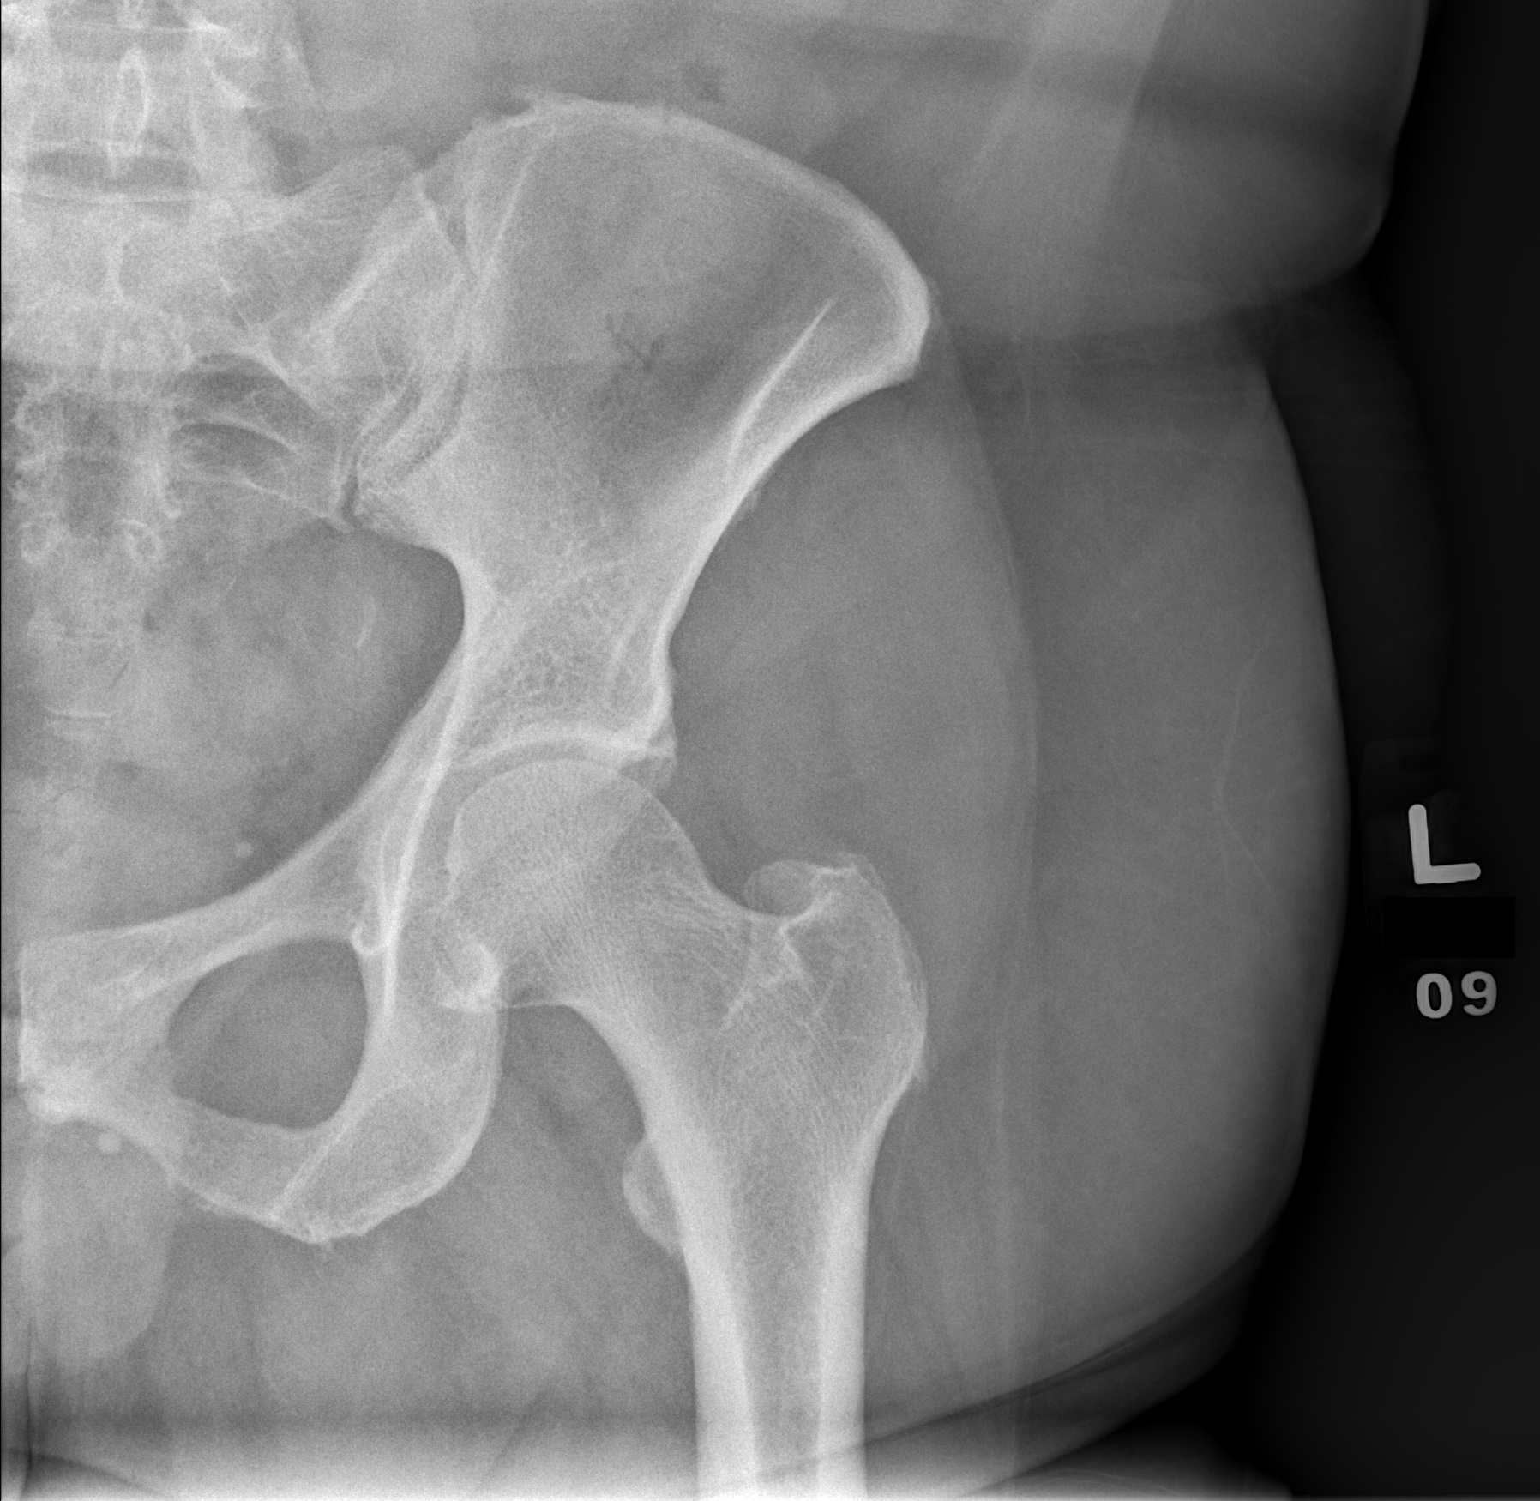

[t hip frog left]
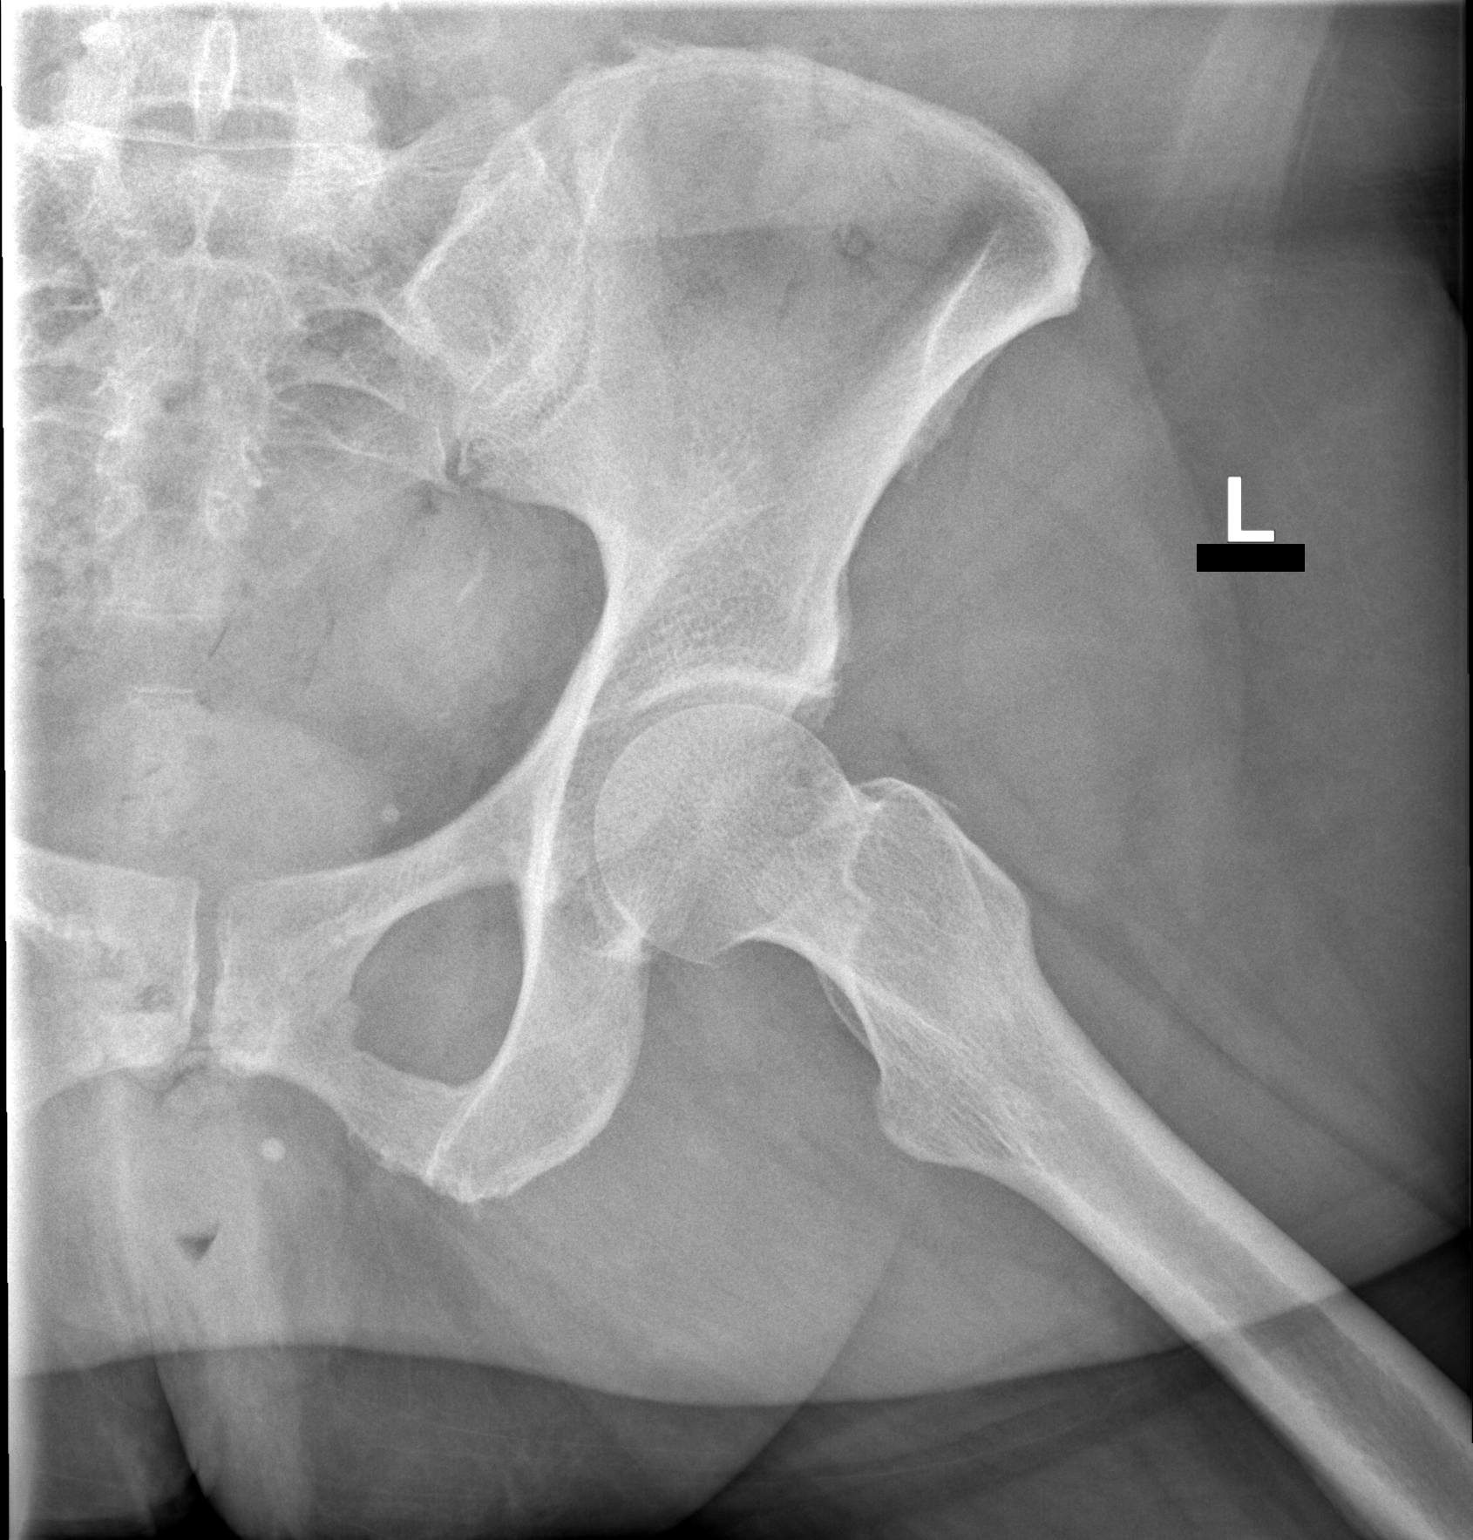

[2 of 2 positions shown; findings below may reference images not displayed]

FINDINGS: No acute fracture. No dislocation. Left hip joint space is intact. Pubic symphysis and left SI joint are intact.
IMPRESSION: Unremarkable left hip radiographs.
